# Patient Record
Sex: Female | Born: 1955 | Race: White | Hispanic: No | Marital: Married | Smoking: Former smoker
Health system: Southern US, Community
[De-identification: ages and names within clinical notes are randomized; demographics above are authoritative.]

## PROBLEM LIST (undated history)

## (undated) DIAGNOSIS — I8500 Esophageal varices without bleeding: Secondary | ICD-10-CM

## (undated) DIAGNOSIS — K7030 Alcoholic cirrhosis of liver without ascites: Secondary | ICD-10-CM

## (undated) DIAGNOSIS — F1021 Alcohol dependence, in remission: Secondary | ICD-10-CM

## (undated) HISTORY — DX: Alcoholic cirrhosis of liver without ascites: K70.30

## (undated) HISTORY — PX: UPPER GASTROINTESTINAL ENDOSCOPY: SHX188

## (undated) HISTORY — PX: APPENDECTOMY: SHX54

## (undated) HISTORY — DX: Esophageal varices without bleeding: I85.00

## (undated) HISTORY — DX: Alcohol dependence, in remission: F10.21

## (undated) HISTORY — PX: OTHER SURGICAL HISTORY: SHX169

---

## 2002-08-13 ENCOUNTER — Encounter: Payer: Self-pay | Admitting: Internal Medicine

## 2002-08-13 ENCOUNTER — Inpatient Hospital Stay (HOSPITAL_COMMUNITY): Admission: EM | Admit: 2002-08-13 | Discharge: 2002-08-20 | Payer: Self-pay | Admitting: Emergency Medicine

## 2002-08-14 ENCOUNTER — Encounter: Payer: Self-pay | Admitting: Internal Medicine

## 2002-08-15 ENCOUNTER — Encounter: Payer: Self-pay | Admitting: Internal Medicine

## 2002-08-16 ENCOUNTER — Encounter: Payer: Self-pay | Admitting: Internal Medicine

## 2002-08-17 ENCOUNTER — Encounter: Payer: Self-pay | Admitting: Internal Medicine

## 2002-08-20 ENCOUNTER — Encounter: Payer: Self-pay | Admitting: Internal Medicine

## 2002-09-04 ENCOUNTER — Ambulatory Visit (HOSPITAL_COMMUNITY): Admission: RE | Admit: 2002-09-04 | Discharge: 2002-09-04 | Payer: Self-pay | Admitting: *Deleted

## 2002-09-04 ENCOUNTER — Encounter: Payer: Self-pay | Admitting: Internal Medicine

## 2002-12-29 ENCOUNTER — Ambulatory Visit (HOSPITAL_COMMUNITY): Admission: RE | Admit: 2002-12-29 | Discharge: 2002-12-29 | Payer: Self-pay | Admitting: Internal Medicine

## 2002-12-29 DIAGNOSIS — K766 Portal hypertension: Secondary | ICD-10-CM

## 2004-08-17 ENCOUNTER — Ambulatory Visit: Payer: Self-pay | Admitting: Internal Medicine

## 2004-08-21 ENCOUNTER — Ambulatory Visit (HOSPITAL_COMMUNITY): Admission: RE | Admit: 2004-08-21 | Discharge: 2004-08-21 | Payer: Self-pay | Admitting: Internal Medicine

## 2004-08-24 ENCOUNTER — Ambulatory Visit: Payer: Self-pay | Admitting: Internal Medicine

## 2004-08-24 DIAGNOSIS — I8500 Esophageal varices without bleeding: Secondary | ICD-10-CM

## 2005-03-26 ENCOUNTER — Ambulatory Visit: Payer: Self-pay | Admitting: Internal Medicine

## 2005-03-28 ENCOUNTER — Ambulatory Visit (HOSPITAL_COMMUNITY): Admission: RE | Admit: 2005-03-28 | Discharge: 2005-03-28 | Payer: Self-pay | Admitting: Internal Medicine

## 2005-09-13 ENCOUNTER — Ambulatory Visit: Payer: Self-pay | Admitting: Internal Medicine

## 2006-02-20 ENCOUNTER — Ambulatory Visit: Payer: Self-pay | Admitting: Internal Medicine

## 2006-02-28 ENCOUNTER — Ambulatory Visit: Payer: Self-pay | Admitting: Internal Medicine

## 2006-02-28 LAB — CONVERTED CEMR LAB: Potassium: 4.2 meq/L (ref 3.5–5.1)

## 2006-05-13 ENCOUNTER — Ambulatory Visit: Payer: Self-pay | Admitting: Internal Medicine

## 2006-05-15 LAB — CONVERTED CEMR LAB
Creatinine, Ser: 0.7 mg/dL (ref 0.4–1.2)
Total CK: 49 units/L (ref 7–177)

## 2006-05-17 ENCOUNTER — Ambulatory Visit (HOSPITAL_COMMUNITY): Admission: RE | Admit: 2006-05-17 | Discharge: 2006-05-17 | Payer: Self-pay | Admitting: Internal Medicine

## 2006-09-04 ENCOUNTER — Ambulatory Visit: Payer: Self-pay | Admitting: Internal Medicine

## 2006-09-04 LAB — CONVERTED CEMR LAB
AFP-Tumor Marker: 5.1 ng/mL (ref 0.0–8.0)
AST: 24 units/L (ref 0–37)
Basophils Absolute: 0 10*3/uL (ref 0.0–0.1)
Bilirubin, Direct: 0.2 mg/dL (ref 0.0–0.3)
Chloride: 110 meq/L (ref 96–112)
Creatinine, Ser: 0.6 mg/dL (ref 0.4–1.2)
Eosinophils Absolute: 0.1 10*3/uL (ref 0.0–0.6)
Eosinophils Relative: 1.6 % (ref 0.0–5.0)
GFR calc non Af Amer: 111 mL/min
Glucose, Bld: 119 mg/dL — ABNORMAL HIGH (ref 70–99)
HCT: 37.4 % (ref 36.0–46.0)
Hemoglobin: 13.4 g/dL (ref 12.0–15.0)
Lymphocytes Relative: 36.1 % (ref 12.0–46.0)
MCHC: 35.8 g/dL (ref 30.0–36.0)
MCV: 87.9 fL (ref 78.0–100.0)
Monocytes Absolute: 0.3 10*3/uL (ref 0.2–0.7)
Neutrophils Relative %: 56.2 % (ref 43.0–77.0)
Potassium: 4 meq/L (ref 3.5–5.1)
RBC: 4.25 M/uL (ref 3.87–5.11)
Sodium: 144 meq/L (ref 135–145)
Total Bilirubin: 0.8 mg/dL (ref 0.3–1.2)
WBC: 5.7 10*3/uL (ref 4.5–10.5)

## 2006-09-06 ENCOUNTER — Ambulatory Visit (HOSPITAL_COMMUNITY): Admission: RE | Admit: 2006-09-06 | Discharge: 2006-09-06 | Payer: Self-pay | Admitting: Internal Medicine

## 2007-07-04 DIAGNOSIS — D696 Thrombocytopenia, unspecified: Secondary | ICD-10-CM

## 2007-07-04 DIAGNOSIS — M810 Age-related osteoporosis without current pathological fracture: Secondary | ICD-10-CM | POA: Insufficient documentation

## 2007-07-07 ENCOUNTER — Ambulatory Visit: Payer: Self-pay | Admitting: Internal Medicine

## 2007-07-07 LAB — CONVERTED CEMR LAB
AFP-Tumor Marker: 4.8 ng/mL (ref 0.0–8.0)
ALT: 18 units/L (ref 0–35)
AST: 25 units/L (ref 0–37)
Albumin: 4.3 g/dL (ref 3.5–5.2)
Alkaline Phosphatase: 83 units/L (ref 39–117)
BUN: 8 mg/dL (ref 6–23)
Basophils Absolute: 0 10*3/uL (ref 0.0–0.1)
Basophils Relative: 0.6 % (ref 0.0–1.0)
Bilirubin, Direct: 0.2 mg/dL (ref 0.0–0.3)
CO2: 28 meq/L (ref 19–32)
Calcium: 9.6 mg/dL (ref 8.4–10.5)
Chloride: 103 meq/L (ref 96–112)
Creatinine, Ser: 0.7 mg/dL (ref 0.4–1.2)
Eosinophils Absolute: 0.1 10*3/uL (ref 0.0–0.6)
Eosinophils Relative: 1.1 % (ref 0.0–5.0)
GFR calc Af Amer: 112 mL/min
GFR calc non Af Amer: 93 mL/min
Glucose, Bld: 103 mg/dL — ABNORMAL HIGH (ref 70–99)
HCT: 38.4 % (ref 36.0–46.0)
Hemoglobin: 13.1 g/dL (ref 12.0–15.0)
INR: 1.1 — ABNORMAL HIGH (ref 0.8–1.0)
Lymphocytes Relative: 27.1 % (ref 12.0–46.0)
MCHC: 34.2 g/dL (ref 30.0–36.0)
MCV: 91.5 fL (ref 78.0–100.0)
Monocytes Absolute: 0.4 10*3/uL (ref 0.2–0.7)
Monocytes Relative: 5.6 % (ref 3.0–11.0)
Neutro Abs: 4.5 10*3/uL (ref 1.4–7.7)
Neutrophils Relative %: 65.6 % (ref 43.0–77.0)
Platelets: 98 10*3/uL — ABNORMAL LOW (ref 150–400)
Potassium: 4 meq/L (ref 3.5–5.1)
Prothrombin Time: 13 s (ref 10.9–13.3)
RBC: 4.19 M/uL (ref 3.87–5.11)
RDW: 12.5 % (ref 11.5–14.6)
Sodium: 140 meq/L (ref 135–145)
TSH: 1.25 microintl units/mL (ref 0.35–5.50)
Total Bilirubin: 0.9 mg/dL (ref 0.3–1.2)
Total Protein: 7.5 g/dL (ref 6.0–8.3)
WBC: 6.8 10*3/uL (ref 4.5–10.5)

## 2007-07-11 ENCOUNTER — Ambulatory Visit (HOSPITAL_COMMUNITY): Admission: RE | Admit: 2007-07-11 | Discharge: 2007-07-11 | Payer: Self-pay | Admitting: Internal Medicine

## 2008-02-18 ENCOUNTER — Ambulatory Visit: Payer: Self-pay | Admitting: Internal Medicine

## 2008-09-07 ENCOUNTER — Ambulatory Visit: Payer: Self-pay | Admitting: Internal Medicine

## 2008-09-08 ENCOUNTER — Ambulatory Visit: Payer: Self-pay | Admitting: Internal Medicine

## 2008-09-09 ENCOUNTER — Ambulatory Visit: Payer: Self-pay | Admitting: Internal Medicine

## 2008-09-13 ENCOUNTER — Ambulatory Visit (HOSPITAL_COMMUNITY): Admission: RE | Admit: 2008-09-13 | Discharge: 2008-09-13 | Payer: Self-pay | Admitting: Internal Medicine

## 2008-09-17 LAB — CONVERTED CEMR LAB
AFP-Tumor Marker: 4.1 ng/mL (ref 0.0–8.0)
ALT: 19 units/L (ref 0–35)
AST: 35 units/L (ref 0–37)
Albumin: 4.2 g/dL (ref 3.5–5.2)
Alkaline Phosphatase: 79 units/L (ref 39–117)
BUN: 10 mg/dL (ref 6–23)
Basophils Absolute: 0 10*3/uL (ref 0.0–0.1)
Basophils Relative: 0.4 % (ref 0.0–3.0)
CO2: 28 meq/L (ref 19–32)
Calcium: 9.4 mg/dL (ref 8.4–10.5)
Chloride: 108 meq/L (ref 96–112)
Cholesterol: 230 mg/dL — ABNORMAL HIGH (ref 0–200)
Creatinine, Ser: 0.7 mg/dL (ref 0.4–1.2)
Direct LDL: 163.1 mg/dL
Eosinophils Absolute: 0.1 10*3/uL (ref 0.0–0.7)
Eosinophils Relative: 2.5 % (ref 0.0–5.0)
GFR calc non Af Amer: 92.26 mL/min (ref >60)
Glucose, Bld: 83 mg/dL (ref 70–99)
HCT: 41.7 % (ref 36.0–46.0)
HDL: 41 mg/dL (ref >39.00)
Hemoglobin: 14.6 g/dL (ref 12.0–15.0)
INR: 1.1 — ABNORMAL HIGH (ref 0.8–1.0)
Lymphocytes Relative: 30.6 % (ref 12.0–46.0)
Lymphs Abs: 1.8 10*3/uL (ref 0.7–4.0)
MCHC: 35 g/dL (ref 30.0–36.0)
MCV: 92.3 fL (ref 78.0–100.0)
Monocytes Absolute: 0.3 10*3/uL (ref 0.1–1.0)
Monocytes Relative: 4.4 % (ref 3.0–12.0)
Neutro Abs: 3.6 10*3/uL (ref 1.4–7.7)
Neutrophils Relative %: 62.1 % (ref 43.0–77.0)
Platelets: 81 10*3/uL — ABNORMAL LOW (ref 150.0–400.0)
Potassium: 4.4 meq/L (ref 3.5–5.1)
Prothrombin Time: 11.9 s (ref 10.9–13.3)
RBC: 4.51 M/uL (ref 3.87–5.11)
RDW: 12.8 % (ref 11.5–14.6)
Sodium: 143 meq/L (ref 135–145)
Total Bilirubin: 1.2 mg/dL (ref 0.3–1.2)
Total CHOL/HDL Ratio: 6
Total Protein: 7.5 g/dL (ref 6.0–8.3)
Triglycerides: 101 mg/dL (ref 0.0–149.0)
VLDL: 20.2 mg/dL (ref 0.0–40.0)
Vit D, 25-Hydroxy: 23 ng/mL — ABNORMAL LOW (ref 30–89)
WBC: 5.8 10*3/uL (ref 4.5–10.5)

## 2009-08-22 ENCOUNTER — Ambulatory Visit: Payer: Self-pay | Admitting: Internal Medicine

## 2009-08-22 DIAGNOSIS — E785 Hyperlipidemia, unspecified: Secondary | ICD-10-CM | POA: Insufficient documentation

## 2009-08-23 ENCOUNTER — Encounter: Payer: Self-pay | Admitting: Internal Medicine

## 2009-08-23 LAB — CONVERTED CEMR LAB
AFP-Tumor Marker: 4.1 ng/mL (ref 0.0–8.0)
ALT: 17 units/L (ref 0–35)
AST: 23 units/L (ref 0–37)
Albumin: 4.2 g/dL (ref 3.5–5.2)
Alkaline Phosphatase: 71 units/L (ref 39–117)
BUN: 9 mg/dL (ref 6–23)
Basophils Absolute: 0 10*3/uL (ref 0.0–0.1)
Basophils Relative: 0.5 % (ref 0.0–3.0)
CO2: 28 meq/L (ref 19–32)
Calcium: 9.1 mg/dL (ref 8.4–10.5)
Chloride: 108 meq/L (ref 96–112)
Cholesterol: 210 mg/dL — ABNORMAL HIGH (ref 0–200)
Creatinine, Ser: 0.7 mg/dL (ref 0.4–1.2)
Direct LDL: 164.2 mg/dL
Eosinophils Absolute: 0.1 10*3/uL (ref 0.0–0.7)
Eosinophils Relative: 1.8 % (ref 0.0–5.0)
GFR calc non Af Amer: 91.94 mL/min (ref >60)
Glucose, Bld: 83 mg/dL (ref 70–99)
HCT: 39.6 % (ref 36.0–46.0)
HDL: 47.2 mg/dL (ref >39.00)
Hemoglobin: 13.7 g/dL (ref 12.0–15.0)
Lymphocytes Relative: 38.3 % (ref 12.0–46.0)
Lymphs Abs: 2 10*3/uL (ref 0.7–4.0)
MCHC: 34.6 g/dL (ref 30.0–36.0)
MCV: 94 fL (ref 78.0–100.0)
Monocytes Absolute: 0.3 10*3/uL (ref 0.1–1.0)
Monocytes Relative: 5.9 % (ref 3.0–12.0)
Neutro Abs: 2.8 10*3/uL (ref 1.4–7.7)
Neutrophils Relative %: 53.5 % (ref 43.0–77.0)
Platelets: 98 10*3/uL — ABNORMAL LOW (ref 150.0–400.0)
Potassium: 4 meq/L (ref 3.5–5.1)
RBC: 4.21 M/uL (ref 3.87–5.11)
RDW: 14 % (ref 11.5–14.6)
Sodium: 144 meq/L (ref 135–145)
TSH: 1.71 microintl units/mL (ref 0.35–5.50)
Total Bilirubin: 0.8 mg/dL (ref 0.3–1.2)
Total CHOL/HDL Ratio: 4
Total Protein: 7.3 g/dL (ref 6.0–8.3)
Triglycerides: 53 mg/dL (ref 0.0–149.0)
VLDL: 10.6 mg/dL (ref 0.0–40.0)
Vit D, 25-Hydroxy: 26 ng/mL — ABNORMAL LOW (ref 30–89)
WBC: 5.2 10*3/uL (ref 4.5–10.5)

## 2009-08-25 ENCOUNTER — Ambulatory Visit (HOSPITAL_COMMUNITY): Admission: RE | Admit: 2009-08-25 | Discharge: 2009-08-25 | Payer: Self-pay | Admitting: Internal Medicine

## 2009-09-02 ENCOUNTER — Telehealth: Payer: Self-pay | Admitting: Internal Medicine

## 2009-09-02 DIAGNOSIS — K7460 Unspecified cirrhosis of liver: Secondary | ICD-10-CM

## 2010-01-13 ENCOUNTER — Telehealth: Payer: Self-pay | Admitting: Internal Medicine

## 2010-06-13 NOTE — Progress Notes (Signed)
Summary: [facility]   Imported By: Sherian Rein 09/05/2009 12:11:50  _____________________________________________________________________  External Attachment:    Type:   Image     Comment:   External Document

## 2010-06-13 NOTE — Progress Notes (Signed)
Summary: ? setting up EGD/Colon in Oct  Phone Note Other Incoming   Summary of Call: -------------------------------------------------------------------------------- From: [email or web address] [[email or web address]]  Sent: Tuesday, January 10, 2010 10:32 AM To: Stan Head Subject:    Hi Dr Leone Payor I hope you and your family are all well. As discussed when I saw you last we spoke about my contacting you regarding the procedure we discussed for when I am next in [place]. We are in [place] the beginning of October, but friends have decided to come and stay with [place], they leave 13th October, then my husband goes to [place] golfing 16th October, this leaves very little time, would you suggest leaving this until next time I am in [place], unless you have the Friday free although I do realise this is a very small window, I will await your decision, thank you Enza Crooke  PLEASE LOOK AT THE DATES SHE INMDICATED HERE (A LITTLE VAGUE) AND SEE IF I HAVE SLOTS AVAILABLE FOR AN ENDO/COLON Initial call taken by: Iva Boop MD, Clementeen Graham,  January 13, 2010 7:17 AM  Follow-up for Phone Call        Dr. Leone Payor, you do not have any slots between 10/13 and 10/16.  Are we able to call the pt to clarify dates or do we have to wait until she is back [place]?  We could do it on the 11th if she is willing to do it while her friends are here. Follow-up by: Francee Piccolo CMA Duncan Dull),  January 13, 2010 4:58 PM  Additional Follow-up for Phone Call Additional follow up Details #1::        I will tell her to try again next time she is here.

## 2010-06-13 NOTE — Progress Notes (Signed)
Summary: [facility]   Imported By: Sherian Rein 09/05/2009 12:08:07  _____________________________________________________________________  External Attachment:    Type:   Image     Comment:   External Document

## 2010-06-13 NOTE — Assessment & Plan Note (Signed)
Summary: F/U CIRRHOSIS   History of Present Illness Visit Type: Follow-up Visit Primary GI MD: Stan Head MD [facility] Primary Provider: n/a Requesting Provider: n/a Chief Complaint: abnormal liver function tests History of Present Illness:   [name] with alcoholic cirrhosis and history of abnormal liver (?nodule) on MRI. Doing well without any problems. No complaints. Her dog died last year at 32 years of age. this was traumatic and she and her husband travelled to [place] and [place] to distract themselves. So, she did not come to [place] last Fall.   GI Review of Systems      Denies abdominal pain, acid reflux, belching, bloating, chest pain, dysphagia with liquids, dysphagia with solids, heartburn, loss of appetite, nausea, vomiting, vomiting blood, weight loss, and  weight gain.      Reports liver problems.     Denies anal fissure, black tarry stools, change in bowel habit, constipation, diarrhea, diverticulosis, fecal incontinence, heme positive stool, hemorrhoids, irritable bowel syndrome, jaundice, light color stool, rectal bleeding, and  rectal pain.    Current Medications (verified): 1)  Caltrate 600+d 600-400 Mg-Unit Tabs (Calcium Carbonate-Vitamin D) .... One Tablet By Mouth Two Times A Day 2)  Boniva 150 Mg Tabs (Ibandronate Sodium) .... Once Every Month  Allergies (verified): No Known Drug Allergies  Past History:  Past Medical History: Reviewed history from 02/18/2008 and no changes required. Alcoholic cirrhosis Small esophageal varies, mild portal gastropathy, prior bleeding 2004 Mild thrombocytopenia Osteoporosis  Past Surgical History: Reviewed history from 07/04/2007 and no changes required. Appendectomy  Family History: Reviewed history from 02/18/2008 and no changes required. No FH of Colon Cancer: Family History of Liver Cancer:Grandfather, Uncle Family History of Stomach Cancer:Grandfather, uncle  Stomach was the primary  cancer Family History of Heart Disease: Mother had stroke  Social History: Reviewed history from 09/07/2008 and no changes required. Patient is a former smoker.  Alcohol Use - occasional verly low alcohol beer Daily Caffeine Use -coffee daily Illicit Drug Use - no Patient gets regular exercise. Married, resides in [place], travels to [place] intermiitently  Vital Signs:  Patient profile:   55 year old female Height:      61.5 inches Weight:      109.50 pounds BMI:     20.43 Pulse rate:   60 / minute Pulse rhythm:   regular BP sitting:   104 / 60  (left arm) Cuff size:   regular  Vitals Entered By: June McMurray CMA Duncan Dull) (August 22, 2009 3:57 PM)  Physical Exam  General:  Well developed, well nourished, no acute distress. Eyes:   no icterus. Lungs:  Clear throughout to auscultation. Heart:  Regular rate and rhythm; no murmurs, rubs,  or bruits. Abdomen:  Soft, nontender and nondistended. No masses, hepatosplenomegaly or hernias noted. Normal bowel sounds. Extremities:  no edema   Impression & Recommendations:  Problem # 1:  CIRRHOSIS, ALCOHOLIC (ICD-571.2) Assessment Unchanged She continues to do well and thrombocytopenia, trace varices/portal gastropathy  in past are only manifestations. Need to pull old paper records re: prior serologies and see if she did receive HBV vaccine. 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 Plan to reassess later this year (Fall).  Problem # 3:  HYPERLIPIDEMIA (ICD-272.4) Assessment: Unchanged  Orders: TLB-Lipid Panel (80061-LIPID)  Problem #  4:  ABNORMAL LIVER ON MRI 1/08 (ICD-793.3) Assessment: Unchanged Nodule  in past. We discussed different or no imaging at this time but she prefers to continue annual MRI. Orders: MRI Abdomen (MRI Abdomen)  Patient Instructions: 1)  Please go to the basement to have your lab tests drawn while fasting tomorrow morning. 2)  Contact Dr. Leone Payor by Email in late August or early September to arrange for Upper Endoscopy and colonoscopy when you are here in October 2011. 3)  Once your MRI and labs are complete we will call you with the results as well as send you copies of the reports.  4)  Your MRI is scheduled for 08/25/09 @ 7PM @ [facility].  Please see seperate instructions. 5)  The medication list was reviewed and reconciled.  All changed / newly prescribed medications were explained.  A complete medication list was provided to the patient / caregiver.  Appended Document: F/U CIRRHOSIS     EGD  Procedure date:  08/13/2002  Findings:      Grade II esophageal varices portal gastropathy esophagitis gastritis,duodenitis  D. Brodie  EGD  Procedure date:  08/17/2002  Findings:      Grade I Varices Portal gastropathy  EGD  Procedure date:  12/29/2002  Findings:      Grade I esophageal varices portal gastropathy  Leone Payor    Impression & Recommendations:  Problem # 1:  CIRRHOSIS, ALCOHOLIC (ICD-571.2) Assessment Comment Only abstinent since hospitalization with bleeding and encephalopathy 2004 small esoph varices and mild gastropathy 2006 EGD HCV Ab neg HAV total + Ferritin not elevatedANA neg AMA neg anti smooth neg TTG Ab neg hx of abnl MRI, ? neoplastic lesions, AFP and f/u MR ok mild thrombocytopenia, splenomegaly   Need to check on hepatitis B immunity status   Appended Document: F/U CIRRHOSIS we called her and she confirms she was vaccinated for Hep B

## 2010-06-13 NOTE — Progress Notes (Signed)
Summary: Hep B status?  Phone Note Outgoing Call   Summary of Call: please contact her (9if still in [place]) re: we do not have her hepatitis B status - thought we did but chart review does not show and she should have Hep B surface antibody and antigen tested if never has 9not in our records) let me know how we can Email her in [place], too Iva Boop MD, [facility]  September 02, 2009 4:13 PM   Follow-up for Phone Call        Left message for patient to call back\par Darcey Nora RN, [facility]  September 05, 2009 9:58 AM  I spoke with the patient she has had Hep B vaccine.  Her email is  [email or web address]   Follow-up by: Darcey Nora RN, CGRN,  September 05, 2009 12:58 PM  Additional Follow-up for Phone Call Additional follow up Details #1::        Patient called     Appended Document: Hep B status? please add that to flowsheet (Hep B)  Appended Document: Hep B status?    Clinical Lists Changes  Observations: Added new observation of HEPBVAX#4: completed series (09/27/2009 14:59)

## 2010-09-25 ENCOUNTER — Telehealth: Payer: Self-pay

## 2010-09-25 DIAGNOSIS — K7460 Unspecified cirrhosis of liver: Secondary | ICD-10-CM

## 2010-09-25 NOTE — Telephone Encounter (Signed)
Patient is scheduled for EGD [facility] 09/27/10 4:00, Pre-visit 09/26/10 8:30 and will also have labs drawn.  MRI is scheduled with [facility] 10/02/10 10:00.  Patient advised.

## 2010-09-25 NOTE — Telephone Encounter (Signed)
Message copied by Darcey Nora on Mon Sep 25, 2010  9:30 AM ------      Message from: Stan Head      Created: Thu Sep 21, 2010  6:07 PM      Regarding: scheduling procedures/MRI       She is arriving in [place] may 11      She is to have MR liver with contrast to follow-up cirrhosis            Will need a CBC, CMET, PT/INR and alpha-fetoprotein            Also needs to schedule an EGD and colonoscopy (varices and screening)            Hopefully her phone # we have is up to date

## 2010-09-26 ENCOUNTER — Other Ambulatory Visit (INDEPENDENT_AMBULATORY_CARE_PROVIDER_SITE_OTHER): Payer: Self-pay

## 2010-09-26 ENCOUNTER — Ambulatory Visit (AMBULATORY_SURGERY_CENTER): Payer: Self-pay | Admitting: *Deleted

## 2010-09-26 VITALS — Ht 61.5 in | Wt 110.1 lb

## 2010-09-26 DIAGNOSIS — I8500 Esophageal varices without bleeding: Secondary | ICD-10-CM

## 2010-09-26 DIAGNOSIS — K7460 Unspecified cirrhosis of liver: Secondary | ICD-10-CM

## 2010-09-26 LAB — CBC WITH DIFFERENTIAL/PLATELET
Basophils Absolute: 0 10*3/uL (ref 0.0–0.1)
Basophils Relative: 0.4 % (ref 0.0–3.0)
Eosinophils Absolute: 0.1 10*3/uL (ref 0.0–0.7)
Eosinophils Relative: 1.9 % (ref 0.0–5.0)
HCT: 41 % (ref 36.0–46.0)
Hemoglobin: 14.2 g/dL (ref 12.0–15.0)
Lymphocytes Relative: 32.7 % (ref 12.0–46.0)
Lymphs Abs: 1.6 10*3/uL (ref 0.7–4.0)
MCHC: 34.6 g/dL (ref 30.0–36.0)
MCV: 93.6 fl (ref 78.0–100.0)
Neutro Abs: 2.8 10*3/uL (ref 1.4–7.7)
Neutrophils Relative %: 58.7 % (ref 43.0–77.0)
Platelets: 104 10*3/uL — ABNORMAL LOW (ref 150.0–400.0)
RBC: 4.38 Mil/uL (ref 3.87–5.11)
RDW: 12.9 % (ref 11.5–14.6)
WBC: 4.8 10*3/uL (ref 4.5–10.5)

## 2010-09-26 LAB — COMPREHENSIVE METABOLIC PANEL
ALT: 15 U/L (ref 0–35)
Albumin: 4.2 g/dL (ref 3.5–5.2)
Alkaline Phosphatase: 73 U/L (ref 39–117)
BUN: 12 mg/dL (ref 6–23)
CO2: 28 mEq/L (ref 19–32)
Calcium: 9.4 mg/dL (ref 8.4–10.5)
Chloride: 107 mEq/L (ref 96–112)
GFR: 113.77 mL/min (ref >60.00)
Glucose, Bld: 81 mg/dL (ref 70–99)
Potassium: 4.2 mEq/L (ref 3.5–5.1)
Sodium: 142 mEq/L (ref 135–145)
Total Bilirubin: 0.9 mg/dL (ref 0.3–1.2)
Total Protein: 7.1 g/dL (ref 6.0–8.3)

## 2010-09-26 LAB — AFP TUMOR MARKER: AFP-Tumor Marker: 3.3 ng/mL (ref 0.0–8.0)

## 2010-09-26 LAB — PROTIME-INR: INR: 1.1 ratio — ABNORMAL HIGH (ref 0.8–1.0)

## 2010-09-26 NOTE — Assessment & Plan Note (Signed)
[facility]                         GASTROENTEROLOGY OFFICE NOTE   NAME:Briana Johnson, Briana Johnson                      MRN:          [id number]  DATE:07/07/2007                            DOB:          26-Jul-1955    CHIEF COMPLAINT:  Followup of alcoholic liver disease and cirrhosis.   HISTORY:  Briana Johnson [place] a [place] of [place]  from [place].  Briana Johnson is really doing well and needs some surveillance  procedures.  Briana Johnson has no particular complaints at this time.   OBJECTIVE:  VITAL SIGNS:  Her weight is stable at 115 pounds.  Her blood  pressure is 118/66 and her pulse is 63.  GENERAL:  Briana Johnson is petite, well-developed, well-nourished.  HEENT:  Eyes anicteric.  LUNGS:  Clear.  HEART:  S1, S2 with no murmurs, rubs or gallops.  ABDOMEN:  Soft, nontender, no organomegaly or mass.  NEUROLOGICAL:  Briana Johnson is alert and oriented x3.  EXTREMITIES:  There is no peripheral edema.   MEDICATIONS:  Her medications are Boniva and Caltrate.   ASSESSMENT:  Briana Johnson has alcoholic cirrhosis.  In the past there have been  some possible lesions suspicious for neoplasia on MRI, though last  year's MRI was okay as well as her alpha fetoprotein.   PLAN:  1. Repeat MRI, alpha fetoprotein, CBC, CMET and Pro Time to restage      things.  Briana Johnson does have a chronic mild thrombocytopenia.  2. Will call those results.  3. Briana Johnson does not wish to pursue an EGD to look at her very small      varices and does not wish to schedule a screening colonoscopy at      this time but will discuss that upon return.     Iva Boop, MD,FACG  Electronically Signed    CEG/MedQ  DD: 07/07/2007  DT: 07/08/2007  Job #: [id number]

## 2010-09-27 ENCOUNTER — Encounter: Payer: Self-pay | Admitting: Internal Medicine

## 2010-09-27 ENCOUNTER — Ambulatory Visit (AMBULATORY_SURGERY_CENTER): Payer: Self-pay | Admitting: Internal Medicine

## 2010-09-27 VITALS — BP 139/69 | HR 74 | Temp 99.3°F | Resp 25 | Ht 61.0 in | Wt 108.0 lb

## 2010-09-27 DIAGNOSIS — R933 Abnormal findings on diagnostic imaging of other parts of digestive tract: Secondary | ICD-10-CM

## 2010-09-27 DIAGNOSIS — I8500 Esophageal varices without bleeding: Secondary | ICD-10-CM

## 2010-09-27 MED ORDER — SODIUM CHLORIDE 0.9 % IV SOLN: 500.0000 mL | INTRAVENOUS | Status: DC

## 2010-09-27 NOTE — Patient Instructions (Addendum)
Your lab testing was fine. The platelets remain low but this is mild and not a problem. The varices remain in the esophagus but are still small and are low risk for rupture and bleeding.  I will call when the MRI results are in. Iva Boop, MD, [facility]  DISCHARGE INSTRUCTIONS ( BLUE & GREEN SHEETS)  REVIEWED WITH PT. AND CAREPARTNER. SAFETY PRECAUTIONS ADVISED DISCUSSED WITH CAREPARTNER DUE TO SEDATING MEDICATIONS RECEIVED TODAY . RECOMMENDED DIET FOR TODAY GIVEN TO CAREPARTNER.

## 2010-09-28 ENCOUNTER — Telehealth: Payer: Self-pay

## 2010-09-28 NOTE — Telephone Encounter (Signed)
Follow up Call- Patient questions:  Do you have a fever, pain , or abdominal swelling? no  Pain Score  0 *  Have you tolerated food without any problems? yes  Have you been able to return to your normal activities? yes  Do you have any questions about your discharge instructions: Diet   no Medications  no Follow up visit  no  Do you have questions or concerns about your Care? no  Actions: * If pain score is 4 or above: No action needed, pain <4. "Every thing is fine.  Thank you for your concern" per pt. MAW

## 2010-09-29 NOTE — Discharge Summary (Signed)
Briana Johnson, Briana Johnson                           ACCOUNT NO.:  [id number]   MEDICAL RECORD NO.:  [id number]                   PATIENT TYPE:  INP   LOCATION:  5709                                 FACILITY:  [facility]   PHYSICIAN:  Iva Boop, M.D. [facility]           DATE OF BIRTH:  03/18/1956   DATE OF ADMISSION:  08/13/2002  DATE OF DISCHARGE:  08/20/2002                                 DISCHARGE SUMMARY   ADMITTING DIAGNOSES:  1. Acute upper gastrointestinal bleed in the setting of coagulopathy.  Rule     out variceal bleed.  2. Severe anemia.  3. Coagulopathy.  4. Hypovolemic shock.  5. Confusion.  6. History of gastroesophageal reflux disease.  7. Status post appendectomy.   DISCHARGE DIAGNOSES:  1. Gastrointestinal bleeding secondary to portal gastropathy.  2. Esophageal varices, portal hypertensive gastropathy and duodenopathy.  3. Gastroesophageal reflux disease.  4. Anemia secondary to gastrointestinal bleed.  Status post transfusions     with a total of four units of packed red blood cells.  5. Hypernatremia, improving, not resolved.  6. Mild hypokalemia, corrected.  7. Cirrhosis of the liver.  The presumed etiology is alcoholism, though the     patient denies heavy volume of alcohol consumption.  8. Coagulopathy secondary to liver disease, improved but not corrected with     fresh frozen plasma and vitamin K.  9. Thrombocytopenia.  10.      Hypotension and shock secondary to gastrointestinal bleed.  11.      Encephalopathy, possible secondary to hepatic etiology, but most     likely secondary to alcohol withdrawal.  12.      Ascites.  Attempt made for diagnostic paracentesis; however, the     fluid was minimal in volume and located and the midline of the abdomen     which increased the risk of perforating blood vessels; therefore,     paracentesis was not performed.   CONSULTATIONS:  Dr. Yancey Flemings for GI evaluation.  Ultimately the patient  was transferred to the  [facility].   PROCEDURES:  Upper endoscopy for the first time on 08/13/02 by Dr. Lina Sar.  No active bleeding was seen, but there was evidence of recent bleed  in the form of coffee ground material in the stomach.  Grade I to II  esophageal varices seen.  Primary degree reflux esophagitis seen.  There was  portal hypertensive gastropathy, along with duodenitis seen, as well.  On  repeat EGD of 08/17/02 by Dr. Stan Head, this showed grade I varices that  had no stigmata of recent bleeding.  These were not large enough to band  ligate.  The gastric mucosa was erythematous, friable, and consistent with  severe portal gastropathy.  A small submucosal nodule in the antrum was also  noted of uncertain etiology.  This was not biopsied, and it was not felt to  be significant.  The duodenum also showed edema and erythema.  Placement of  central line by Dr. Rene Paci.   BRIEF HISTORY:  Briana Johnson is a pleasant 55 year old white female who lives  with Briana Johnson in [place].  Briana Johnson has business in the [place]  area, and so they have recently bought a house in [place].  They will be  here on occasional visits throughout the coming years.  They were in  [place] when the patient, as well as Briana Johnson, developed diarrhea and  nausea.  The Johnson's symptoms resolved fairly quickly.  The patient's  symptoms persisted, and she was throwing up what the Johnson describes as  coffee ground like; however, at the time he was not aware that this  signified GI bleeding.  She was also having dark loose stools.  They went to  see Dr. Amador Cunas on a couple of office visits earlier in the week prior  to admission.  A rectal exam was not performed, and no blood tests were  performed.  The diagnosis was that of food poisoning.  She was sent home  with orders to stick to liquids and to contact his office if there were any  further problems.  They got in touch with Dr.  Vernon Prey office by the  next day and were phoned in a prescription for Phenergan.  However, the  patient's Johnson became alarmed when the patient's mental status took a  turn for the worse, she got confused, and she was continuing to throw up and  have diarrhea.  He called 911, and she was transported to the emergency room  with a pulse of 126 and a blood pressure of 108/60s.  Indeed, she was  showing signs of acute encephalopathy.  Briana hemoglobin was 3.9.  Briana INR was  2.7.  BUN was 107 with a creatinine of 1.5.  Transaminases were elevated.  At that point, GI was involved, along with Dr. Felicity Coyer from the primary  care service, and she was admitted with suspicion of chronic liver disease  with acute variceal bleed.   LABORATORY DATA:  Initial white blood cell count was 25.3; it declined to  13.6.  Hemoglobin went from 3.9 to discharge level of 12.6.  Hematocrit went  from 11.8 to discharge level of 37.  Sodium reached a high of 151; it was  140 at discharge.  Potassium initially 2.6; it was 4.5 at discharge.  Glucose ranged anywhere between 101 and 133.  Calcium low at 7.1.  Albumin  low at 2.6.  BUN initially 64; corrected to 19.  Creatinine initially 1.0;  corrected to 0.9.  PT 25.2 at admission; 19.4 on final assay.  INR went from  2.7 down to 1.8.  PTT of 37.  Total bilirubin maximum 3.2, alkaline  phosphatase maximum 78, AST maximum 260, ALT maximum of 73.  Hepatitis C  antibody was negative.  Alpha fetoprotein level was 11, which is mildly  abnormal, reference range being 0 to 8.0.  CK 134, CK-MB 3.8, and troponin I  of 0.01.  UA and urine cultures negative.  Blood gases - the pH was 7.479,  PCO2 27.3, PO2 275, bicarb 20, and that is on 100% face mask.   IMAGING STUDIES:  Ultrasound of the abdomen showed thickened gallbladder  wall and gallbladder sludge, as well as cirrhosis and ascites.  Spleen was normal in size, measuring 11.4 cm.  The initial portable chest x-ray  showed  decreased interstitial opacity and perihilar air space opacity consistent  with improved edema.  Chest x-ray showed continued improvement in pulmonary  vascular congestion on 08/17/02 and again on 08/20/02.   HOSPITAL COURSE:  Problem 1.  Upper GI bleed.  The patient was in shock  secondary to blood and volume losses when she hit the emergency room.  She  was volume resuscitated with transfusions of packed red blood cells, as well  as normal saline.  She received a total of 4 units of packed red blood cells  during this admission.  The evening of Briana admission, she underwent urgent  upper endoscopy by Dr. Lina Sar.  The source of the bleeding was felt  probably the portal gastropathy, as the esophageal varices were rather small  and showed no stigmata of recent bleeding.  The patient was started on an  octreotide drip and stabilized with IV fluid hydration and cessation of  active bleeding.  After five days, the octreotide drip was discontinued, and  she had no further evidence for ongoing or recurrent GI bleeding.  Problem 2.  Anemia.  The anemia the patient had was certainly due to the  upper GI bleed, though suspect she may have a chronic underlying anemia due  to chronic liver disease, but this is only a guess.  In any event, she  responded well to transfusions, and once initially transfused with the  initial four units of packed red cells within the first 24 hours of  admission she did not require further packed red blood cells.  Problem 3.  Cirrhosis of the liver.  The patient had no inkling that she had  any underlying liver disease until this acute GI bleeding event occurred.  From Briana and Briana Johnson's history, she drinks half a bottle of wine most  nights, and may have a cocktail or two of whiskey, so perhaps 2-3 ounces of  whiskey a day.  During Briana hospitalization, she became acutely agitated in a  pattern more consistent with alcohol withdrawal than with alcoholic   encephalopathy and was treated with an Ativan taper.  She was mentally  unresponsive for the first four or five days of this admission, but once the  Ativan taper was discontinued, she woke up and became quite lucid and seemed  to have no chronic signs for encephalopathy.  She did received lactulose  early on, but Briana ammonia levels normalized, and the lactulose was  discontinued.  Problem 4.  Hypokalemia.  The patient was aggressively supplemented with IV  potassium, and Briana potassium level corrected.  Problem 5.  Coagulopathy.  This was noted when she came in, and she did  receive four units of fresh frozen plasma along with IV vitamin K.  Coags  responded, but did not completely normalize with these measures.  Problem 6.  Thrombocytopenia.  This is certainly a problem related to Briana  cirrhosis.  She never had critically low platelets, and she did not require  transfusions with any platelets. Problem 7.  Hypernatremia.  Dr. Leone Payor felt this might be secondary to  lactulose, and this was another reason for discontinuing this medication.  By the time of Briana discharge, Briana sodium was within normal limits.  Problem 8.  Hypotension and shock.  Once she was volume resuscitated and  received blood, the blood pressures improved, but she never had  significantly high blood pressure readings.  Toward the end of Briana ICU stay,  Briana blood pressures stabilized in the 120's/60-70 range.  The last 24 hours  of hospitalization, she was having  reading in the 90's to low 100's  systolic, and in the 50's to 60's diastolically.  She seemed to tolerate  these low blood pressures well, and some of this is certainly the affect of  the Inderal which was started in order to reduce portal pressures.  Since  she is tolerating this medication well and denies any dizziness or  orthostatic symptoms, we will continue this medication.  The patient did  require placement of a central IV line in order to maximize  volume  resuscitation.   CONDITION AT DISCHARGE:  Stable and improved.   PROGNOSIS:  Mrs. Ortmann and Briana Johnson have been made aware of the serious  condition of Briana liver.  At this point, if she stays off alcohol, she would  certainly be able to get placed on a transplant list.  The patient was  advised that she could also have episodes of gastrointestinal bleeding in  the future, and that she should be aware of anytime that she starts having  nausea and throwing up dark material or bloody material, as well as passing  dark or loose stools.  She is strongly advised not to consume any alcoholic  beverages.   RETURN OFFICE VISIT:  She has an appointment to see Dr. Yancey Flemings back in  the office on 09/01/02 at 10:15 in the morning.  At that point, he can  recheck Briana and give Briana and Briana Johnson the okay, if warranted, for Briana to  travel back to [place].  In the meantime, she is going to be staying at  their home in [place].   MEDICATIONS AT DISCHARGE:  1. Inderal/propranolol 50 mg one p.o. b.i.d.  2. Prilosec 20 mg one p.o. daily.  Okay for Briana to use over-the-counter     Prilosec.   DIET:  Low sodium.  A copy of a 4 gm sodium diet was printed out for the  patient to give Briana some guidelines.  She was also reminded, and is well  aware, that she should not drink alcohol.   ACTIVITY:  The patient was advised to take it easy, that she would probably  fatigue more easily than usual, and that she was not to lift anything  heavier than 10 pounds.   She is to limit any use of aspirin or nonsteroidal type pain relievers, and  it would be best for Briana not to use them at all, but once or twice a month  is probably okay.  For general pain relief, she can use Tylenol 1-2 per day  on occasion, but not to be using it on a daily basis.     Brett Canales, P.A. [facility]                    Iva Boop, M.D. [facility]    SG/MEDQ  D:  08/20/2002  T:  08/22/2002  Job:  [id number]

## 2010-09-29 NOTE — Op Note (Signed)
Briana Johnson, Briana Johnson                           ACCOUNT NO.:  [id number]   MEDICAL RECORD NO.:  [id number]                   PATIENT TYPE:  INP   LOCATION:  2115                                 FACILITY:  [facility]   PHYSICIAN:  Lina Sar, M.D. [facility]               DATE OF BIRTH:  1955/12/16   DATE OF PROCEDURE:  08/13/2002  DATE OF DISCHARGE:                                 OPERATIVE REPORT   PROCEDURE:  Upper endoscopy   GASTROENTEROLOGIST:  Hedwig Morton. Juanda Chance, M.D.   INDICATIONS FOR PROCEDURE:  This 55 year old white female was admitted today  with acute upper GI bleed and melanic stools.  Hemoglobin was 8 g.  Hemoccult-positive stool and severe coagulopathy with history of significant  alcohol use. She has no history of previous upper GI  bleed.  She has been  resuscitated with 6 units of packed cells and given 4 units of fresh frozen  plasma.  Her hemoglobin is 12.8.  She is undergoing upper endoscopy to  further evaluate her upper GI blood loss which is suspected to be related to  portal hypertension.   ENDOSCOPE:  Fujinon single-channel endoscope.   SEDATION:  Versed 10 mg IV, fentanyl 25 mcg IV.   FINDINGS:  Fujinon single-channel endoscope was passed under direct vision  through posterior pharynx into the esophagus.  The patient was monitored by  pulse oximetry.  Oxygen saturation was 95 to 98%.  The patient was rather  cooperative during the procedure.  Her gag reflex was diminished.  Proximal  esophageal mucosa showed some nonspecific erythema.  In the distal two-  thirds of the esophagus, there were 1 to 2+ esophageal varices. On  expiration, varices appeared to be 2+, on inspiration 0 to 1+.  The largest  varices appeared to be at the GE junction at 35 cm from the incisors.  There  was no blood in the esophagus.  There were some coffee ground specks  refluxing from the stomach. There was mild esophagitis overlying the distal  esophagus.  Three strings of esophageal varices  were identified.  There was  no significant hiatal hernia.   STOMACH:  The stomach was insufflated with air and showed a moderate amount  of coffee ground material which coated the gastric mucosa.  There was no  blood in the stomach. Mucosa was edematous with complete loss of submucosal  blood vessels.  There was friability as the endoscope traversed through the  stomach.  The trauma induced to the gastric mucosa caused some contact  bleeding.  Retroflexion of endoscope revealed no definite gastric varices.  Again, there were some abrasions over the gastric mucosa as the result of  the retroflexion of the endoscope.  Gastric antrum was also edematous but  with large folds converging into the pyloric outlet which was normal.   DUODENUM:  Duodenum bulb was inflamed.  No duodenal varices but some bright  red  blood and mucosal erosions throughout the bulb and the descending  duodenum.  These were consistent with acute duodenitis.  No definite varices  were noted in descending duodenum.  The duodenal bulb as well as stomach  were washed out to reveal any mucosal lesions other than edema, erythema,  and portal hypertensive gastropathy, but there was no discrete ulceration.   IMPRESSION:  1. Second grade esophageal varices.  2. First grade reflux esophagitis.  3. Hypertensive gastropathy.  4. Gastritis and duodenitis  5. No active bleeding, but there is evidence of recent gastrointestinal     blood loss with presence of coffee ground material in the stomach.   PLAN:  Esophageal varices were not banded or injected because there was no  active bleeding and because of the emergency procedure being carried out in  the middle of the night and patient being in VTs and coagulopathy not being  corrected completely.  She will be observed overnight.  We will hold off any  nasogastric suction, keep her sedated.  The patient will continue Protonix  IV and octreotide drip as well as Carafate slurry.   The patient's liver  disease will be evaluated as to the etiology and the severity, but because  of the GI bleed, which has been clearly related to the portal hypertension,  we may consider banding of the esophageal varices, especially the distal  ones which seem to be protruding into the esophageal lumen.   I have talked with patient's husband as to alcohol rehabilitation and future  modification of her lifestyle.                                                Lina Sar, M.D. [facility]    DB/MEDQ  D:  08/13/2002  T:  08/14/2002  Job:  [id number]

## 2010-09-29 NOTE — Assessment & Plan Note (Signed)
HEALTHCARE                           GASTROENTEROLOGY OFFICE NOTE   NAME:Struve, ELLAINA SCHULER                      MRN:          [id number]  DATE:02/20/2006                            DOB:          Aug 10, 1955    CHIEF COMPLAINT:  Follow up cirrhosis.   Ms. Brodzinski returns. In [place] she had a bone densitometry study that  was abnormal showing osteoporosis in the spine and osteopenia in the hip.  She was recommended to take Risedronate which is Actonel. She has held off  because of fears of gastrointestinal problems. She was given a calcium and  vitamin D supplement as well. She has not yet started that. Otherwise, she  has been well. She is getting ready to take a trip to [place] and [place]  and has recommended to have some vaccines including yellow fever and  malaria, typhoid, diphtheria, tetanus booster, and polio. She wonders if  these are safe.   She is willing to consider a screening colonoscopy and a follow up of her  endoscopy and MRI, but cannot really do it until January when she returns.  She is only here for a limited time.   She is on no other medications except some occasional Tylenol at this point  or in the past. Weight is stable at 114 pounds, blood pressure 122/58.   ASSESSMENT:  1. Alcoholic cirrhosis doing well, compensated. Her only problem has been      some thrombocytopenia. Previous serologic workup is unrevealing.   1. New diagnosis of osteoporosis. Could be related to fat malabsorption      from chronic liver disease. She is [place]. Sprue comes to mind as one      possibility or link.   PLAN:  1. CBC, CMET, alpha fetoprotein, tissue transglutaminase antibody today. I      would also check a 25 hydroxy vitamin D level and phosphorus level      related to her osteoporosis.  2. I think the Risedronate is a reasonable treatment for her and even      though there are risks of ulceration, etc., she should take it  and      watch for those complications.  3. Vaccines are appropriate as well. I see no contraindications from my      search and up to date.  4. I have talked about a possible hepatitis B vaccine. She has already had      hepatitis A vaccine. She does not really have any obvious risk factors      of hepatitis B, but that is reasonable to pursue.  5. She will see me in early 2008 in January at which point we will arrange      a followup MRI and      probably an EGD and colonoscopy. I will contact her with her lab      results and any other recommendations.       Iva Boop, MD,FACG      CEG/MedQ  DD:  02/20/2006  DT:  02/22/2006  Job #:  [id number]   cc:  Edythe Lynn, M.D.

## 2010-09-29 NOTE — Assessment & Plan Note (Signed)
[facility]                         GASTROENTEROLOGY OFFICE NOTE   NAME:Briana Johnson, Briana Johnson                      MRN:          [id number]  DATE:09/04/2006                            DOB:          1956/01/20    CHIEF COMPLAINT:  Followup of cirrhosis and abnormal MRI.  Briana Johnson  has come over from [place] again.  She has been traveling the world to  the [place], and [place] in the past  few months as well.  She is here for 2 weeks now.  Her MRI for followup  of cirrhosis on May 17, 2006 demonstrated a focal area of hyper-  enhancement in the central right liver lobe that needed followup in 3 to  6 months.  She had trace perihepatic ascites and possible left liver  dysplastic nodule is reported as well, though there was no convincing  evidence of hepatocellular carcinoma.  She also is asking for a refill  of her Sandrea Hammond that she takes and says that the [facility]  only gives her 1 or 2 at a time so that she does not overdose on it.   MEDICATIONS:  1. She is on Caltrate 600 mg plus D twice daily.  2. Boniva 1 every month.   PROBLEMS:  1. Alcoholic cirrhosis, now abstinent.  2. Thrombocytopenia.  3. Previous history of gastrointestinal bleeding, question varices,      question portal gastropathy.  She has had very small varices on      last exam of August 24, 2004.  4. Due for colonoscopy for screening purposes.  5. Osteoporosis.   PHYSICAL EXAM:  Limited to vital signs today.  Height 5 feet 1.5 inches,  weight 113 pounds, pulse 72, blood pressure 110/70.   ASSESSMENT AND PLAN:  1. Alcoholic cirrhosis, abstinent, doing well overall.  2. Abnormal MRI, question whether this represents neoplasm versus      cirrhotic changes.  3. History of gastrointestinal bleeding with small varices and portal      gastropathy.  She has done very well off of beta blocker.  4. Osteoporosis.  5. Thrombocytopenia related  to #1.   PLAN:  1. Go ahead and get her repeat dedicated MRI of the liver and I will      call with results.  2. I will give her a prescription for the Boniva to have while she is      [place].  Dispense #6, use once a month.  3. CMET, CBC, alpha-fetoprotein, and a Pro Time today.  4. [place] states in [place] and we will try to set up      her endoscopy for variceal      surveillance as well as a screening colonoscopy at that time,      although that will depend on what we see on the MRI.     Iva Boop, MD,FACG  Electronically Signed    CEG/MedQ  DD: 09/04/2006  DT: 09/04/2006  Job #: [id number]

## 2010-09-29 NOTE — Assessment & Plan Note (Signed)
[facility]                         GASTROENTEROLOGY OFFICE NOTE   NAME:PETRIEYuriko, Portales                      MRN:          [id number]  DATE:05/13/2006                            DOB:          09/09/1955    Ms. Ricciardelli returns.  We were to talk about scheduling an EGD and a  colonoscopy to follow up for varices and a screen.  She is not on any  calcium supplement though she is taking some vitamin D.  She is not sure  how much.  She is on a monthly bisphosphonate.   At this time, she says she does not have enough time while she is here  in this state to do her EGD and colonoscopy and would like to schedule  those in a few months.  We will go ahead and schedule her MRI to follow  up her liver disease and I have recommended she take Caltrate with  vitamin D to get 1200 mg calcium and 800 units of vitamin D daily and I  will call the results of her MRI and she will see him when she returns  to the states to set up her EGD and colonoscopy.     Iva Boop, MD,FACG  Electronically Signed    CEG/MedQ  DD: 05/13/2006  DT: 05/13/2006  Job #: [id number]

## 2010-10-02 ENCOUNTER — Ambulatory Visit
Admission: RE | Admit: 2010-10-02 | Discharge: 2010-10-02 | Disposition: A | Discharge status: Home / Self Care | Payer: Self-pay | Source: Ambulatory Visit | Attending: Internal Medicine | Admitting: Internal Medicine

## 2010-10-02 DIAGNOSIS — K7460 Unspecified cirrhosis of liver: Secondary | ICD-10-CM

## 2010-10-02 MED ORDER — GADOBENATE DIMEGLUMINE 529 MG/ML IV SOLN
10.0000 mL | Freq: Once | INTRAVENOUS | Status: AC | PRN
  Administered 2010-10-02: 10 mL via INTRAVENOUS

## 2010-10-03 ENCOUNTER — Encounter: Payer: Self-pay | Admitting: Internal Medicine

## 2010-10-03 NOTE — Progress Notes (Signed)
Quick Note:  Please let her know the liver still shows cirrhosis but no nodules or other signs of liver tumor or cancer. Can repeat this or other imaging ([place]) in 1 year - she can let me know as usual Please provide copy of MRI to her I still recommend a colonoscopy when she wishes to schedule  She can let me know when she is returning to [place] within the next year to arrange follow-up overall she appears to be doing well without signs of progression of liver disease  ______

## 2011-09-14 ENCOUNTER — Encounter: Payer: Self-pay | Admitting: Internal Medicine

## 2011-09-21 ENCOUNTER — Other Ambulatory Visit (INDEPENDENT_AMBULATORY_CARE_PROVIDER_SITE_OTHER): Payer: Self-pay

## 2011-09-21 ENCOUNTER — Ambulatory Visit (INDEPENDENT_AMBULATORY_CARE_PROVIDER_SITE_OTHER): Payer: Self-pay | Admitting: Internal Medicine

## 2011-09-21 ENCOUNTER — Encounter: Payer: Self-pay | Admitting: Internal Medicine

## 2011-09-21 VITALS — BP 122/70 | HR 60 | Ht 61.5 in | Wt 102.6 lb

## 2011-09-21 DIAGNOSIS — K7030 Alcoholic cirrhosis of liver without ascites: Secondary | ICD-10-CM

## 2011-09-21 LAB — CBC WITH DIFFERENTIAL/PLATELET
Basophils Absolute: 0 10*3/uL (ref 0.0–0.1)
Eosinophils Absolute: 0.1 10*3/uL (ref 0.0–0.7)
HCT: 41.7 % (ref 36.0–46.0)
Lymphs Abs: 2.5 10*3/uL (ref 0.7–4.0)
MCV: 93 fl (ref 78.0–100.0)
Monocytes Absolute: 0.4 10*3/uL (ref 0.1–1.0)
Monocytes Relative: 5.2 % (ref 3.0–12.0)
Platelets: 135 10*3/uL — ABNORMAL LOW (ref 150.0–400.0)
RDW: 13.6 % (ref 11.5–14.6)

## 2011-09-21 LAB — COMPREHENSIVE METABOLIC PANEL
ALT: 13 U/L (ref 0–35)
AST: 19 U/L (ref 0–37)
Alkaline Phosphatase: 83 U/L (ref 39–117)
Sodium: 141 mEq/L (ref 135–145)
Total Bilirubin: 0.5 mg/dL (ref 0.3–1.2)
Total Protein: 7.8 g/dL (ref 6.0–8.3)

## 2011-09-21 NOTE — Assessment & Plan Note (Addendum)
Stable without obvious problems Repeat labs, MRI  Have offered screening colonoscopy again - she declined

## 2011-09-21 NOTE — Patient Instructions (Signed)
Your physician has requested that you go to the basement for the following lab work before leaving today: CBC, CMET, Protime, AFP  You have been set up for a MRI of your liver at [facility].  Arrive at 2:45pm for a 3pm scan.

## 2011-09-22 ENCOUNTER — Encounter: Payer: Self-pay | Admitting: Internal Medicine

## 2011-09-22 NOTE — Progress Notes (Signed)
Quick Note:  Labs are fine - normal except mildly low platelets as always. Please let her know and mail her a copy of the labs   Once MR results are in will notify and send those ______

## 2011-09-22 NOTE — Progress Notes (Signed)
  Subjective:    Patient ID: CHAYCE RULLO, female    DOB: July 30, 1955, 56 y.o.   MRN: [id number]  HPI This is a delightful 56 yo woman here for annual follow-up. She continues to see me 1-2 times a year when she travels to [place] from her home in [place]. She reports no problems at this time. She says she did hemoccults vis the [facility] in [place] and they were negative. She would like annual lab follow-up and MRI to look for possible hepatomain setting of cirrhosis.   She continues to travel the world with her husband, going to [place] in January.  She is here for goddaughter's graduation from [employer]   Review of Systems No other complaints    Objective:   Physical Exam General:  NAD Eyes:   anicteric Lungs:  clear Heart:  S1S2 no rubs, murmurs or gallops Abdomen:  soft and nontender, BS+ Ext:   no edema       Assessment & Plan:

## 2011-09-24 NOTE — Progress Notes (Signed)
Pt notified of lab results per Dr. Leone Payor and copy mailed to pt.

## 2011-09-28 ENCOUNTER — Ambulatory Visit (HOSPITAL_COMMUNITY)
Admission: RE | Admit: 2011-09-28 | Discharge: 2011-09-28 | Disposition: A | Discharge status: Home / Self Care | Payer: Self-pay | Source: Ambulatory Visit | Attending: Internal Medicine | Admitting: Internal Medicine

## 2011-09-28 DIAGNOSIS — K7689 Other specified diseases of liver: Secondary | ICD-10-CM | POA: Insufficient documentation

## 2011-09-28 DIAGNOSIS — I868 Varicose veins of other specified sites: Secondary | ICD-10-CM | POA: Insufficient documentation

## 2011-09-28 DIAGNOSIS — K7030 Alcoholic cirrhosis of liver without ascites: Secondary | ICD-10-CM

## 2011-09-28 DIAGNOSIS — K7460 Unspecified cirrhosis of liver: Secondary | ICD-10-CM | POA: Insufficient documentation

## 2011-09-28 DIAGNOSIS — N281 Cyst of kidney, acquired: Secondary | ICD-10-CM | POA: Insufficient documentation

## 2011-09-28 MED ORDER — GADOBENATE DIMEGLUMINE 529 MG/ML IV SOLN
9.0000 mL | Freq: Once | INTRAVENOUS | Status: AC | PRN
  Administered 2011-09-28: 9 mL via INTRAVENOUS

## 2011-09-30 NOTE — Progress Notes (Signed)
Quick Note:  Please let her know things are ok - stable and no signs of cancer Mail her a copy or fax if desired ______

## 2012-09-24 ENCOUNTER — Encounter: Payer: Self-pay | Admitting: Internal Medicine

## 2012-09-24 ENCOUNTER — Other Ambulatory Visit (INDEPENDENT_AMBULATORY_CARE_PROVIDER_SITE_OTHER): Payer: Self-pay

## 2012-09-24 ENCOUNTER — Ambulatory Visit (INDEPENDENT_AMBULATORY_CARE_PROVIDER_SITE_OTHER): Payer: Self-pay | Admitting: Internal Medicine

## 2012-09-24 VITALS — BP 90/60 | HR 60 | Ht 61.5 in | Wt 105.2 lb

## 2012-09-24 DIAGNOSIS — K7030 Alcoholic cirrhosis of liver without ascites: Secondary | ICD-10-CM

## 2012-09-24 DIAGNOSIS — I8500 Esophageal varices without bleeding: Secondary | ICD-10-CM

## 2012-09-24 LAB — PROTIME-INR: Prothrombin Time: 12.2 s (ref 10.2–12.4)

## 2012-09-24 LAB — CBC WITH DIFFERENTIAL/PLATELET
Basophils Absolute: 0 10*3/uL (ref 0.0–0.1)
Eosinophils Relative: 1.2 % (ref 0.0–5.0)
HCT: 41.1 % (ref 36.0–46.0)
Lymphocytes Relative: 30.8 % (ref 12.0–46.0)
Lymphs Abs: 2.5 10*3/uL (ref 0.7–4.0)
Monocytes Relative: 5.5 % (ref 3.0–12.0)
Neutrophils Relative %: 61.9 % (ref 43.0–77.0)
Platelets: 142 10*3/uL — ABNORMAL LOW (ref 150.0–400.0)
RDW: 13.5 % (ref 11.5–14.6)
WBC: 8.1 10*3/uL (ref 4.5–10.5)

## 2012-09-24 LAB — COMPREHENSIVE METABOLIC PANEL
ALT: 14 U/L (ref 0–35)
AST: 20 U/L (ref 0–37)
Albumin: 4.3 g/dL (ref 3.5–5.2)
Alkaline Phosphatase: 85 U/L (ref 39–117)
BUN: 13 mg/dL (ref 6–23)
CO2: 27 mEq/L (ref 19–32)
Calcium: 9.7 mg/dL (ref 8.4–10.5)
Chloride: 105 mEq/L (ref 96–112)
Creatinine, Ser: 0.8 mg/dL (ref 0.4–1.2)
GFR: 82.71 mL/min (ref >60.00)
Glucose, Bld: 87 mg/dL (ref 70–99)
Potassium: 4.2 mEq/L (ref 3.5–5.1)
Sodium: 141 mEq/L (ref 135–145)
Total Bilirubin: 0.8 mg/dL (ref 0.3–1.2)
Total Protein: 7.5 g/dL (ref 6.0–8.3)

## 2012-09-24 NOTE — Patient Instructions (Addendum)
Your physician has requested that you go to the basement for lab work before leaving today.   You have been scheduled for an MRI at [facility]   on May 16th 2014 . Your appointment time is 10:00am . Please arrive 15 minutes prior to your appointment time for registration purposes. There is no prep for this test. However, if you have any metal in your body, have a pacemaker or defibrillator, please be sure to let your ordering physician know. This test typically takes 45 minutes to 1 hour to complete.  Please check your vaccine records from [place] and make sure you have had a Hepatitis B and pneumovax.  Thank you for choosing me and  Gastroenterology.  Iva Boop, M.D., [facility]

## 2012-09-24 NOTE — Progress Notes (Signed)
  Subjective:    Patient ID: Briana Johnson, female    DOB: 05-Aug-1955, 57 y.o.   MRN: [id number]  HPI Briana Johnson returns for an annual visit - as she is her in [place] visiting friends. She has had a good year overall and has no complaints. Planning a trip to [place] next year. She had hemoccults sent to her by the [facility] and says they were negative.  No Known Allergies Outpatient Prescriptions Prior to Visit  Medication Sig Dispense Refill  . ibandronate (BONIVA) 150 MG tablet Take 150 mg by mouth every 30 (thirty) days. Take in the morning with a full glass of water, on an empty stomach, and do not take anything else by mouth or lie down for the next 30 min.        Facility-Administered Medications Prior to Visit  Medication Dose Route Frequency Provider Last Rate Last Dose  . 0.9 %  sodium chloride infusion  500 mL Intravenous Continuous Iva Boop, MD       Past Medical History  Diagnosis Date  . Osteoporosis     osteopenia  . Esophageal varices   . Alcoholic cirrhosis   . History of alcoholism    Past Surgical History  Procedure Laterality Date  . Appendectomy    . Right breast mass    . Upper gastrointestinal endoscopy  last 09/2010,'06,4/04,8/04    trace -1+esophageal varices   History   Social History  . Marital Status: Married    Spouse Name: N/A            Occupational History  . Retired    Social History Main Topics  . Smoking status: Former Smoker    Quit date: 09/26/2002  . Smokeless tobacco: Never Used  . Alcohol Use: No  . Drug Use: No    Social History Narrative   Married to Briana Johnson.      Lives in [place]    Review of Systems As above - ok    Objective:   Physical Exam General:  NAD Eyes:   anicteric Lungs:  clear Heart:  S1S2 no rubs, murmurs or gallops Abdomen:  soft and nontender, BS+ Ext:   no edema      Assessment & Plan:  CIRRHOSIS, ALCOHOLIC  ESOPHAGEAL VARICES - Small  1. She has decided to pursue a screening MRI to  check for hepatoma 2. Labs to include CBC, CMET, AFP and INR 3. Has done well w/ Tx of small varices - B blocker caused fatigue - may need an EGD next year to recheck 4. F/U 1 year likely 5. She is immune to HAV, thinks she has had HBV vaccine as she keeps up on those due to world travel - have advised she make sure she has had HBV and pneumonia vaccines

## 2012-09-26 ENCOUNTER — Other Ambulatory Visit: Payer: Self-pay | Admitting: Internal Medicine

## 2012-09-26 ENCOUNTER — Ambulatory Visit (HOSPITAL_COMMUNITY)
Admission: RE | Admit: 2012-09-26 | Discharge: 2012-09-26 | Disposition: A | Discharge status: Home / Self Care | Payer: Self-pay | Source: Ambulatory Visit | Attending: Internal Medicine | Admitting: Internal Medicine

## 2012-09-26 DIAGNOSIS — I8500 Esophageal varices without bleeding: Secondary | ICD-10-CM | POA: Insufficient documentation

## 2012-09-26 DIAGNOSIS — K7030 Alcoholic cirrhosis of liver without ascites: Secondary | ICD-10-CM

## 2012-09-26 DIAGNOSIS — K7460 Unspecified cirrhosis of liver: Secondary | ICD-10-CM | POA: Insufficient documentation

## 2012-09-26 DIAGNOSIS — N281 Cyst of kidney, acquired: Secondary | ICD-10-CM | POA: Insufficient documentation

## 2012-09-26 MED ORDER — GADOBENATE DIMEGLUMINE 529 MG/ML IV SOLN
10.0000 mL | Freq: Once | INTRAVENOUS | Status: AC | PRN
  Administered 2012-09-26: 9 mL via INTRAVENOUS

## 2012-09-26 NOTE — Progress Notes (Signed)
Quick Note:  MRI and labs ok Mild thrombocytopenia is stable Please let her know and if she wants send copies of office note, labs and MRI report ______

## 2013-06-10 ENCOUNTER — Encounter: Payer: Self-pay | Admitting: Internal Medicine

## 2013-09-17 ENCOUNTER — Encounter: Payer: Self-pay | Admitting: Internal Medicine

## 2013-09-17 ENCOUNTER — Other Ambulatory Visit (INDEPENDENT_AMBULATORY_CARE_PROVIDER_SITE_OTHER): Payer: Self-pay

## 2013-09-17 ENCOUNTER — Ambulatory Visit (INDEPENDENT_AMBULATORY_CARE_PROVIDER_SITE_OTHER): Payer: Self-pay | Admitting: Internal Medicine

## 2013-09-17 VITALS — BP 128/62 | HR 92 | Ht 60.75 in | Wt 101.0 lb

## 2013-09-17 DIAGNOSIS — K7460 Unspecified cirrhosis of liver: Secondary | ICD-10-CM

## 2013-09-17 LAB — CBC WITH DIFFERENTIAL/PLATELET
Basophils Absolute: 0.1 10*3/uL (ref 0.0–0.1)
Basophils Relative: 0.9 % (ref 0.0–3.0)
EOS PCT: 0.8 % (ref 0.0–5.0)
Eosinophils Absolute: 0.1 10*3/uL (ref 0.0–0.7)
HCT: 40.3 % (ref 36.0–46.0)
Hemoglobin: 13.7 g/dL (ref 12.0–15.0)
Lymphocytes Relative: 33.8 % (ref 12.0–46.0)
Lymphs Abs: 2.3 10*3/uL (ref 0.7–4.0)
MCHC: 34 g/dL (ref 30.0–36.0)
MCV: 93.7 fl (ref 78.0–100.0)
MONOS PCT: 4.8 % (ref 3.0–12.0)
Monocytes Absolute: 0.3 10*3/uL (ref 0.1–1.0)
NEUTROS PCT: 59.7 % (ref 43.0–77.0)
Neutro Abs: 4 10*3/uL (ref 1.4–7.7)
PLATELETS: 144 10*3/uL — AB (ref 150.0–400.0)
RBC: 4.3 Mil/uL (ref 3.87–5.11)
RDW: 13.1 % (ref 11.5–15.5)
WBC: 6.7 10*3/uL (ref 4.0–10.5)

## 2013-09-17 LAB — COMPREHENSIVE METABOLIC PANEL
ALBUMIN: 4.2 g/dL (ref 3.5–5.2)
ALK PHOS: 72 U/L (ref 39–117)
ALT: 15 U/L (ref 0–35)
AST: 18 U/L (ref 0–37)
BUN: 10 mg/dL (ref 6–23)
CO2: 27 meq/L (ref 19–32)
Calcium: 9.4 mg/dL (ref 8.4–10.5)
Chloride: 105 mEq/L (ref 96–112)
Creatinine, Ser: 0.7 mg/dL (ref 0.4–1.2)
GFR: 85.01 mL/min (ref >60.00)
GLUCOSE: 115 mg/dL — AB (ref 70–99)
POTASSIUM: 3.9 meq/L (ref 3.5–5.1)
SODIUM: 139 meq/L (ref 135–145)
TOTAL PROTEIN: 7 g/dL (ref 6.0–8.3)
Total Bilirubin: 0.6 mg/dL (ref 0.2–1.2)

## 2013-09-17 LAB — PROTIME-INR
INR: 1.1 ratio — AB (ref 0.8–1.0)
Prothrombin Time: 12.5 s (ref 9.6–13.1)

## 2013-09-17 NOTE — Patient Instructions (Signed)
You have been scheduled for an MRI at [facility] on 09/22/13. Your appointment time is 9:00pm. Please arrive 15 minutes prior to your appointment time for registration purposes.  Do not eat after 6:00pm. If you have any metal in your body, have a pacemaker or defibrillator, please be sure to let your ordering physician know. This test typically takes 45 minutes to 1 hour to complete.   Your physician has requested that you go to the basement for the following lab work before leaving today: CBC/diff, CMET, PT/INR , AFP   I appreciate the opportunity to care for you.

## 2013-09-17 NOTE — Progress Notes (Signed)
         Subjective:    Patient ID: Briana Johnson, female    DOB: [birth year], 58 y.o.   MRN: [id number]  HPI The patient is here for annual followup of her cirrhosis. She denies any problems she would like her annual MRI and lab testing. She feels like she is doing well overall. Medications, allergies, past medical history, past surgical history, family history and social history are reviewed and updated in the EMR.   Review of Systems As above    Objective:   Physical Exam General:  NAD Eyes:   anicteric Lungs:  clear Heart:  S1S2 no rubs, murmurs or gallops Abdomen:  soft and nontender, BS+ no hepatosplenomegaly or mass Ext:   no edema     Assessment & Plan:   1. Cirrhosis    1. Alpha-fetoprotein, CBC, protime INR and comprehensive metabolic panel 2. MRI of the liver with and without contrast given her history of nodules and her concern for detecting development of hepatocellular carcinoma

## 2013-09-18 NOTE — Progress Notes (Signed)
Quick Note:  Results given to patient ______ 

## 2013-09-22 ENCOUNTER — Ambulatory Visit (HOSPITAL_COMMUNITY)
Admission: RE | Admit: 2013-09-22 | Discharge: 2013-09-22 | Disposition: A | Discharge status: Home / Self Care | Payer: Self-pay | Source: Ambulatory Visit | Attending: Internal Medicine | Admitting: Internal Medicine

## 2013-09-22 DIAGNOSIS — K7689 Other specified diseases of liver: Secondary | ICD-10-CM | POA: Insufficient documentation

## 2013-09-22 DIAGNOSIS — K7030 Alcoholic cirrhosis of liver without ascites: Secondary | ICD-10-CM | POA: Insufficient documentation

## 2013-09-22 DIAGNOSIS — N281 Cyst of kidney, acquired: Secondary | ICD-10-CM | POA: Insufficient documentation

## 2013-09-22 DIAGNOSIS — K7460 Unspecified cirrhosis of liver: Secondary | ICD-10-CM

## 2013-09-22 MED ORDER — GADOXETATE DISODIUM 0.25 MMOL/ML IV SOLN
5.0000 mL | Freq: Once | INTRAVENOUS | Status: AC | PRN
  Administered 2013-09-22: 5 mL via INTRAVENOUS

## 2013-09-23 NOTE — Progress Notes (Signed)
Quick Note:  MRI is stable - cirrhosis but no signs of liver tumors/cancer We can repeat in 1 year Let her know - provide copies of this report + labs and husband's CT and labs Ask how husband is doing also (has diverticulitis0 ______

## 2013-10-02 ENCOUNTER — Encounter: Payer: Self-pay | Admitting: Internal Medicine

## 2013-10-02 NOTE — Telephone Encounter (Signed)
Error

## 2014-08-19 ENCOUNTER — Telehealth: Payer: Self-pay

## 2014-08-19 DIAGNOSIS — K7030 Alcoholic cirrhosis of liver without ascites: Secondary | ICD-10-CM

## 2014-08-19 NOTE — Telephone Encounter (Signed)
-----   Message from Iva Booparl E Gessner, MD sent at 08/18/2014  8:51 AM EDT ----- Sure - like we have done and we can arrange the usual labs - CBC, CMET, AFP and PT/INR  Dx cirrhosis ----- Message -----    From: Annett FabianSheri L Jones, RN    Sent: 08/17/2014  11:10 AM      To: Iva Booparl E Gessner, MD  Coming in from [place] in May wants an MRI when she is here ok to set up.  Has a follow up on 5/17

## 2014-08-19 NOTE — Telephone Encounter (Signed)
Patient is scheduled for 9:00 09/29/14 at [facility] She will call one day next week from [place] to get the appt details

## 2014-08-24 NOTE — Telephone Encounter (Signed)
Patient called and is notified of MRI date and to come for labs prior to the appt on 09/28/14

## 2014-09-21 ENCOUNTER — Other Ambulatory Visit (INDEPENDENT_AMBULATORY_CARE_PROVIDER_SITE_OTHER): Payer: Self-pay

## 2014-09-21 DIAGNOSIS — K7030 Alcoholic cirrhosis of liver without ascites: Secondary | ICD-10-CM

## 2014-09-21 LAB — CBC WITH DIFFERENTIAL/PLATELET
Basophils Absolute: 0 K/uL (ref 0.0–0.1)
Basophils Relative: 0.5 % (ref 0.0–3.0)
Eosinophils Absolute: 0.1 K/uL (ref 0.0–0.7)
Eosinophils Relative: 2 % (ref 0.0–5.0)
HCT: 40.3 % (ref 36.0–46.0)
Hemoglobin: 14.1 g/dL (ref 12.0–15.0)
Lymphocytes Relative: 40.3 % (ref 12.0–46.0)
Lymphs Abs: 2.4 K/uL (ref 0.7–4.0)
MCHC: 34.9 g/dL (ref 30.0–36.0)
MCV: 90.3 fl (ref 78.0–100.0)
Monocytes Absolute: 0.4 K/uL (ref 0.1–1.0)
Monocytes Relative: 5.9 % (ref 3.0–12.0)
Neutro Abs: 3.1 K/uL (ref 1.4–7.7)
Neutrophils Relative %: 51.3 % (ref 43.0–77.0)
Platelets: 147 K/uL — ABNORMAL LOW (ref 150.0–400.0)
RBC: 4.47 Mil/uL (ref 3.87–5.11)
RDW: 13 % (ref 11.5–15.5)
WBC: 5.9 K/uL (ref 4.0–10.5)

## 2014-09-21 LAB — COMPREHENSIVE METABOLIC PANEL
ALBUMIN: 4.1 g/dL (ref 3.5–5.2)
ALK PHOS: 78 U/L (ref 39–117)
ALT: 11 U/L (ref 0–35)
AST: 17 U/L (ref 0–37)
BILIRUBIN TOTAL: 0.6 mg/dL (ref 0.2–1.2)
BUN: 10 mg/dL (ref 6–23)
CO2: 28 meq/L (ref 19–32)
Calcium: 9.6 mg/dL (ref 8.4–10.5)
Chloride: 106 mEq/L (ref 96–112)
Creatinine, Ser: 0.7 mg/dL (ref 0.40–1.20)
GFR: 90.34 mL/min (ref >60.00)
GLUCOSE: 77 mg/dL (ref 70–99)
POTASSIUM: 3.9 meq/L (ref 3.5–5.1)
SODIUM: 140 meq/L (ref 135–145)
TOTAL PROTEIN: 7.2 g/dL (ref 6.0–8.3)

## 2014-09-21 LAB — PROTIME-INR
INR: 1.1 ratio — ABNORMAL HIGH (ref 0.8–1.0)
Prothrombin Time: 12.3 s (ref 9.6–13.1)

## 2014-09-22 LAB — AFP TUMOR MARKER: AFP-Tumor Marker: 2.1 ng/mL (ref <6.1)

## 2014-09-24 NOTE — Progress Notes (Signed)
Quick Note:  Labs ok Will review 5/17 REV ______

## 2014-09-28 ENCOUNTER — Ambulatory Visit (INDEPENDENT_AMBULATORY_CARE_PROVIDER_SITE_OTHER): Payer: Self-pay | Admitting: Internal Medicine

## 2014-09-28 ENCOUNTER — Encounter: Payer: Self-pay | Admitting: Internal Medicine

## 2014-09-28 VITALS — BP 118/60 | HR 70 | Ht 60.75 in | Wt 102.0 lb

## 2014-09-28 DIAGNOSIS — I8500 Esophageal varices without bleeding: Secondary | ICD-10-CM

## 2014-09-28 DIAGNOSIS — K7460 Unspecified cirrhosis of liver: Secondary | ICD-10-CM

## 2014-09-28 NOTE — Patient Instructions (Signed)
Today we are giving you a copy of your lab work that has been done this year.   We will be in touch with your MRI results.    Great to see you.   I appreciate the opportunity to care for you. Stan Headarl Gessner, M.D., [facility]

## 2014-09-28 NOTE — Assessment & Plan Note (Addendum)
Doing well overall by labs and MR has returned and shows cirrhosis and some esophgeal varices - these were trace-flat 2012 EGD Had fatigue w/ B blocker so stopped. Could make sense to repeat an EGD Other than sl low PLT she is compensated and Child's A Consider AFP next time also

## 2014-09-28 NOTE — Progress Notes (Signed)
Subjective:  Cc: cirrhosis  Patient ID: Briana Johnson, female    DOB: 1955/11/04, 59 y.o.   MRN: [id number]  HPI Delightful middle-aged [place] woman ([place]here for follow-up. Last seen 1 year ago. Does not have any complaints. Doing well and only medication is for osteoporosis. Says had negative hemoccults through [facility]. She traveled to [place] in Feb 2016 and had a great trip. She has done very well over the years since a 2004 hspitalization with variceal bleed and encephalopathy - and has abstained from EtOH. Has had some dysplastic nodules on MR at times but none in a while Had called and had labs before this visit and MR is 5/17.  No Known Allergies Outpatient Prescriptions Prior to Visit  Medication Sig Dispense Refill  . ibandronate (BONIVA) 150 MG tablet Take 150 mg by mouth every 30 (thirty) days. Take in the morning with a full glass of water, on an empty stomach, and do not take anything else by mouth or lie down for the next 30 min.      No facility-administered medications prior to visit.   Past Medical History  Diagnosis Date  . Osteoporosis     osteopenia  . Esophageal varices   . Alcoholic cirrhosis   . History of alcoholism    Past Surgical History  Procedure Laterality Date  . Appendectomy    . Right breast mass    . Upper gastrointestinal endoscopy  last 09/2010,'06,4/04,8/04    trace -1+esophageal varices   History   Social History  . Marital Status: Married    Spouse Name: N/A  . Number of Children: 1  . Years of Education: N/A   Occupational History  . Retired    Social History Main Topics  . Smoking status: Former Smoker    Quit date: 09/26/2002  . Smokeless tobacco: Never Used  . Alcohol Use: No  . Drug Use: No    Social History Narrative   Married to [place].   No children   Lives in [place]   Family History  Problem Relation Age of Onset  . Stomach cancer Father   . Colon cancer       Review of  Systems As per HPI    Objective:   Physical Exam @BP  118/60 mmHg  Pulse 70  Ht 5' 0.75" (1.543 m)  Wt 102 lb (46.267 kg)  BMI 19.43 kg/m2@  General:  Well-developed, well-nourished and in no acute distress Eyes:  anicteric. Lungs: Clear to auscultation bilaterally. Heart:  S1S2, no rubs, murmurs, gallops. Abdomen:  soft, non-tender, no hepatosplenomegaly, hernia, or mass and BS+.  Extremities:   no edema, cyanosis or clubbing Skin   no rash and no stigmata of liver disease. Neuro:  A&O x 3.  Psych:  appropriate mood and  Affect.   Data Reviewed: Lab Results  Component Value Date   WBC 5.9 09/21/2014   HGB 14.1 09/21/2014   HCT 40.3 09/21/2014   MCV 90.3 09/21/2014   PLT 147.0* 09/21/2014     Chemistry      Component Value Date/Time   NA 140 09/21/2014 0910   K 3.9 09/21/2014 0910   CL 106 09/21/2014 0910   CO2 28 09/21/2014 0910   BUN 10 09/21/2014 0910   CREATININE 0.70 09/21/2014 0910      Component Value Date/Time   CALCIUM 9.6 09/21/2014 0910   ALKPHOS 78 09/21/2014 0910   AST 17 09/21/2014 0910   ALT 11 09/21/2014 0910  BILITOT 0.6 09/21/2014 [id number]     Lab Results  Component Value Date   INR 1.1* 09/21/2014   INR 1.1* 09/17/2013   INR 1.2* 09/24/2012       Assessment & Plan:   .Hepatic cirrhosis Doing well overall by labs and MR has returned and shows cirrhosis and some esophgeal varices - these were trace-flat 2012 EGD Had fatigue w/ B blocker so stopped. Could make sense to repeat an EGD Other than sl low PLT she is compensated and Child's A Consider AFP next time also    Esophageal varices Consider EGD next visit

## 2014-09-29 ENCOUNTER — Ambulatory Visit (HOSPITAL_COMMUNITY)
Admission: RE | Admit: 2014-09-29 | Discharge: 2014-09-29 | Disposition: A | Discharge status: Home / Self Care | Payer: Self-pay | Source: Ambulatory Visit | Attending: Internal Medicine | Admitting: Internal Medicine

## 2014-09-29 ENCOUNTER — Encounter: Payer: Self-pay | Admitting: Internal Medicine

## 2014-09-29 DIAGNOSIS — K7030 Alcoholic cirrhosis of liver without ascites: Secondary | ICD-10-CM | POA: Insufficient documentation

## 2014-09-29 MED ORDER — GADOXETATE DISODIUM 0.25 MMOL/ML IV SOLN
5.0000 mL | Freq: Once | INTRAVENOUS | Status: AC | PRN
  Administered 2014-09-29: 5 mL via INTRAVENOUS

## 2014-09-29 NOTE — Progress Notes (Signed)
Quick Note:  Mri shows cirrhosis as before but no signs of cancer Please print me a copy so I can send to her  ______

## 2014-09-29 NOTE — Assessment & Plan Note (Signed)
Consider EGD next visit

## 2014-10-01 ENCOUNTER — Telehealth: Payer: Self-pay | Admitting: Internal Medicine

## 2014-10-01 NOTE — Telephone Encounter (Signed)
Patient notified of results.

## 2015-09-06 ENCOUNTER — Encounter: Payer: Self-pay | Admitting: Internal Medicine

## 2015-09-06 ENCOUNTER — Ambulatory Visit (INDEPENDENT_AMBULATORY_CARE_PROVIDER_SITE_OTHER): Payer: Self-pay | Admitting: Internal Medicine

## 2015-09-06 ENCOUNTER — Other Ambulatory Visit (INDEPENDENT_AMBULATORY_CARE_PROVIDER_SITE_OTHER): Payer: Self-pay

## 2015-09-06 VITALS — BP 102/58 | HR 64 | Ht 61.5 in | Wt 99.0 lb

## 2015-09-06 DIAGNOSIS — K7460 Unspecified cirrhosis of liver: Secondary | ICD-10-CM

## 2015-09-06 LAB — COMPREHENSIVE METABOLIC PANEL
ALBUMIN: 4.4 g/dL (ref 3.5–5.2)
ALT: 10 U/L (ref 0–35)
AST: 17 U/L (ref 0–37)
Alkaline Phosphatase: 71 U/L (ref 39–117)
BILIRUBIN TOTAL: 0.6 mg/dL (ref 0.2–1.2)
BUN: 9 mg/dL (ref 6–23)
CALCIUM: 9.6 mg/dL (ref 8.4–10.5)
CHLORIDE: 106 meq/L (ref 96–112)
CO2: 28 meq/L (ref 19–32)
Creatinine, Ser: 0.67 mg/dL (ref 0.40–1.20)
GFR: 94.72 mL/min (ref >60.00)
Glucose, Bld: 101 mg/dL — ABNORMAL HIGH (ref 70–99)
Potassium: 4.4 mEq/L (ref 3.5–5.1)
Sodium: 142 mEq/L (ref 135–145)
Total Protein: 7.3 g/dL (ref 6.0–8.3)

## 2015-09-06 LAB — CBC WITH DIFFERENTIAL/PLATELET
BASOS ABS: 0.1 10*3/uL (ref 0.0–0.1)
Basophils Relative: 1 % (ref 0.0–3.0)
Eosinophils Absolute: 0.1 10*3/uL (ref 0.0–0.7)
Eosinophils Relative: 2.1 % (ref 0.0–5.0)
HCT: 39.2 % (ref 36.0–46.0)
Hemoglobin: 13.4 g/dL (ref 12.0–15.0)
LYMPHS PCT: 33.5 % (ref 12.0–46.0)
Lymphs Abs: 2.3 10*3/uL (ref 0.7–4.0)
MCHC: 34.1 g/dL (ref 30.0–36.0)
MCV: 92 fl (ref 78.0–100.0)
MONOS PCT: 5.7 % (ref 3.0–12.0)
Monocytes Absolute: 0.4 10*3/uL (ref 0.1–1.0)
NEUTROS ABS: 4 10*3/uL (ref 1.4–7.7)
NEUTROS PCT: 57.7 % (ref 43.0–77.0)
Platelets: 170 10*3/uL (ref 150.0–400.0)
RBC: 4.26 Mil/uL (ref 3.87–5.11)
RDW: 13.4 % (ref 11.5–15.5)
WBC: 6.9 10*3/uL (ref 4.0–10.5)

## 2015-09-06 LAB — PROTIME-INR
INR: 1.1 ratio — AB (ref 0.8–1.0)
Prothrombin Time: 11.8 s (ref 9.6–13.1)

## 2015-09-06 NOTE — Patient Instructions (Signed)
  Your physician has requested that you go to the basement for the following lab work before leaving today: CBC/diff, CMET, PT/INR, AFP    You have been scheduled for an MRI at [facility] on 09/08/15. Your appointment time is 7:00AM. Please arrive 15 minutes prior to your appointment time for registration purposes. Please make certain not to have anything to eat or drink 6 hours prior to your test. In addition, if you have any metal in your body, have a pacemaker or defibrillator, please be sure to let your ordering physician know. This test typically takes 45 minutes to 1 hour to complete.    I appreciate the opportunity to care for you.

## 2015-09-06 NOTE — Progress Notes (Signed)
   Subjective:    Patient ID: Briana Johnson, female    DOB: [birth year], 60 y.o.   MRN: [id number] Chief complaint: Annual follow-up cirrhosis HPI Patient is here for follow-up. She is doing well, she is visiting the country and she does every time about this point in the year. She feels well but would like follow-up of her cirrhosis. She continues to abstain from alcohol. She has no significant complaints at this time. She [facility] and [place]. She and her husband went to [place] in the past year on holiday. No Known Allergies Outpatient Prescriptions Prior to Visit  Medication Sig Dispense Refill  . ibandronate (BONIVA) 150 MG tablet Take 150 mg by mouth every 30 (thirty) days. Take in the morning with a full glass of water, on an empty stomach, and do not take anything else by mouth or lie down for the next 30 min.      No facility-administered medications prior to visit.   Past Medical History  Diagnosis Date  . Osteoporosis     osteopenia  . Esophageal varices (HCC)   . Alcoholic cirrhosis (HCC)   . History of alcoholism [facility](HCC)    Past Surgical History  Procedure Laterality Date  . Appendectomy    . Right breast mass    . Upper gastrointestinal endoscopy  last 09/2010,'06,4/04,8/04    trace -1+esophageal varices   Social History   Social History  . Marital Status: Married    Spouse Name: N/A  . Number of Children: N/A  . Years of Education: N/A   Occupational History  . Retired    Social History Main Topics  . Smoking status: Former Smoker    Quit date: 09/26/2002  . Smokeless tobacco: Never Used  . Alcohol Use: No  . Drug Use: No  . Sexual Activity: Not Asked   Other Topics Concern  . None   Social History Narrative   Married to [place].   No children   Lives in [place]   Family History  Problem Relation Age of Onset  . Stomach cancer Father   . Colon cancer     Review of Systems As above    Objective:   Physical Exam @BP  102/58 mmHg  Pulse 64  Ht 5' 1.5" (1.562 m)  Wt 99 lb (44.906 kg)  BMI 18.41 kg/m2@  General:  NAD Eyes:   anicteric Lungs:  clear Heart:: S1S2 no rubs, murmurs or gallops Abdomen:  soft and nontender, BS+ no Barrett's but him a mass Ext:   no edema, cyanosis or clubbing Skin:  No stigmata of chronic liver disease     Assessment & Plan:   1. Cirrhosis of liver without ascites, unspecified hepatic cirrhosis type (HCC)    CBC, metabolic panel alpha-fetoprotein and INR will be checked today. She requests a repeat MRI, we have been doing that and that is not unreasonable though she's had stability over many years if the most sensitive way to screen for liver cancer. We'll proceed with that and let her know. She will see me in a year sooner if needed most likely.  I have not really screened for follow-up on her trace varices. She originally had varices that were bleeding when she presented years ago, her varices have been flat ever since. We could consider doing that in the future if she desires. I feel she is very low risk for recurrent bleeding.

## 2015-09-07 ENCOUNTER — Telehealth: Payer: Self-pay | Admitting: Internal Medicine

## 2015-09-07 LAB — AFP TUMOR MARKER: AFP TUMOR MARKER: 2.5 ng/mL (ref <6.1)

## 2015-09-07 NOTE — Telephone Encounter (Signed)
[facility] calling requesting Dr. Leone PayorGessner sign off on the MRI order that is in epic. Dr. Leone PayorGessner notified.

## 2015-09-08 ENCOUNTER — Ambulatory Visit (HOSPITAL_COMMUNITY)
Admission: RE | Admit: 2015-09-08 | Discharge: 2015-09-08 | Disposition: A | Discharge status: Home / Self Care | Payer: Self-pay | Source: Ambulatory Visit | Attending: Internal Medicine | Admitting: Internal Medicine

## 2015-09-08 DIAGNOSIS — K7460 Unspecified cirrhosis of liver: Secondary | ICD-10-CM | POA: Insufficient documentation

## 2015-09-08 DIAGNOSIS — K766 Portal hypertension: Secondary | ICD-10-CM | POA: Insufficient documentation

## 2015-09-08 MED ORDER — GADOXETATE DISODIUM 0.25 MMOL/ML IV SOLN
5.0000 mL | Freq: Once | INTRAVENOUS | Status: AC | PRN
  Administered 2015-09-08: 5 mL via INTRAVENOUS

## 2015-09-08 NOTE — Progress Notes (Signed)
Quick Note:  See mri result note Will cc patient Also cc my progress note to her ______

## 2015-09-08 NOTE — Progress Notes (Signed)
Quick Note:  MRI is ok - stable cirrhosis and no tumors Please send her a copy of this report and her recent labs which were also ok  Looking over everything only other recommendation I have is to consider having an EGD to see how big her esophageal varices are - they were small before and she has not had bleeding in a long time so I do not feel strongly that she needs this though per expert guidelines this is appropriate. We could consider that the next time she comes. ______

## 2017-03-20 ENCOUNTER — Ambulatory Visit: Payer: Self-pay | Admitting: Internal Medicine

## 2017-03-20 ENCOUNTER — Encounter: Payer: Self-pay | Admitting: Internal Medicine

## 2017-03-20 ENCOUNTER — Other Ambulatory Visit (INDEPENDENT_AMBULATORY_CARE_PROVIDER_SITE_OTHER): Payer: Self-pay

## 2017-03-20 ENCOUNTER — Ambulatory Visit (INDEPENDENT_AMBULATORY_CARE_PROVIDER_SITE_OTHER): Payer: Self-pay | Admitting: Internal Medicine

## 2017-03-20 DIAGNOSIS — K7460 Unspecified cirrhosis of liver: Secondary | ICD-10-CM

## 2017-03-20 LAB — CBC WITH DIFFERENTIAL/PLATELET
BASOS ABS: 0.1 10*3/uL (ref 0.0–0.1)
Basophils Relative: 0.9 % (ref 0.0–3.0)
EOS ABS: 0.1 10*3/uL (ref 0.0–0.7)
Eosinophils Relative: 1.6 % (ref 0.0–5.0)
HEMATOCRIT: 39.5 % (ref 36.0–46.0)
Hemoglobin: 13.2 g/dL (ref 12.0–15.0)
LYMPHS PCT: 35.1 % (ref 12.0–46.0)
Lymphs Abs: 2.2 10*3/uL (ref 0.7–4.0)
MCHC: 33.5 g/dL (ref 30.0–36.0)
MCV: 94.2 fl (ref 78.0–100.0)
Monocytes Absolute: 0.4 10*3/uL (ref 0.1–1.0)
Monocytes Relative: 5.6 % (ref 3.0–12.0)
NEUTROS ABS: 3.5 10*3/uL (ref 1.4–7.7)
NEUTROS PCT: 56.8 % (ref 43.0–77.0)
PLATELETS: 158 10*3/uL (ref 150.0–400.0)
RBC: 4.19 Mil/uL (ref 3.87–5.11)
RDW: 13.6 % (ref 11.5–15.5)
WBC: 6.2 10*3/uL (ref 4.0–10.5)

## 2017-03-20 LAB — COMPREHENSIVE METABOLIC PANEL
ALBUMIN: 4.3 g/dL (ref 3.5–5.2)
ALK PHOS: 70 U/L (ref 39–117)
ALT: 10 U/L (ref 0–35)
AST: 16 U/L (ref 0–37)
BILIRUBIN TOTAL: 0.7 mg/dL (ref 0.2–1.2)
BUN: 10 mg/dL (ref 6–23)
CO2: 26 mEq/L (ref 19–32)
Calcium: 9.5 mg/dL (ref 8.4–10.5)
Chloride: 107 mEq/L (ref 96–112)
Creatinine, Ser: 0.71 mg/dL (ref 0.40–1.20)
GFR: 88.15 mL/min (ref >60.00)
Glucose, Bld: 99 mg/dL (ref 70–99)
POTASSIUM: 3.9 meq/L (ref 3.5–5.1)
Sodium: 141 mEq/L (ref 135–145)
TOTAL PROTEIN: 7.3 g/dL (ref 6.0–8.3)

## 2017-03-20 LAB — PROTIME-INR
INR: 1.1 ratio — AB (ref 0.8–1.0)
Prothrombin Time: 11.8 s (ref 9.6–13.1)

## 2017-03-20 NOTE — Progress Notes (Signed)
   Briana Johnson 61 y.o. 10/10/1955 [id number]  Assessment & Plan:   Hepatic cirrhosis Stable MR and labs will be done this week and I will get her the results.  We exchanged cell phone number and I have her email.  She cannot access my chart because she does not have a Orthoptistocial Security number.     Subjective:   Chief Complaint: Follow-up of cirrhosis annual visit  HPI The patient is here for follow-up, she is visiting [place] again.  She is here for a wedding.  She tells me that she and Briana Johnson have sold their [place] house so have no address here anymore her physician [facility] [place] said she had been on [place] for 7 years and it was time to stop it.  She will get a follow-up bone density in 3 years.  She feels well without any complaints but would like I have her routine follow-up MRI and laboratory testing regarding her history of alcoholic liver disease and cirrhosis without complications. No Known Allergies No outpatient medications have been marked as taking for the 03/20/17 encounter (Office Visit) with Briana Johnson, Briana E, MD.   Past Medical History:  Diagnosis Date  . Alcoholic cirrhosis (HCC)   . Esophageal varices (HCC)   . History of alcoholism (HCC)   . Osteoporosis    osteopenia   Past Surgical History:  Procedure Laterality Date  . APPENDECTOMY    . right breast mass    . UPPER GASTROINTESTINAL ENDOSCOPY  last 09/2010,'06,4/04,8/04   trace -1+esophageal varices   Social History   Occupational History  . Occupation: Retired  Tobacco Use  . Smoking status: Former Smoker    Last attempt to quit: 09/26/2002    Years since quitting: 14.4  . Smokeless tobacco: Never Used  Substance and Sexual Activity  . Alcohol use: No    Alcohol/week: 0.0 oz  . Drug use: No            Social History Narrative   Married to Briana Johnson.   No children   Lives in [place]   family history includes Colon cancer in her unknown relative; Stomach  cancer in her father.   Review of Systems As above  Objective:   Physical Exam @BP  130/68   Pulse 64   Ht 5\' 1"  (1.549 m)   Wt 98 lb (44.5 kg)   BMI 18.52 kg/m @  General:  NAD Eyes:   anicteric Lungs:  clear Heart::  S1S2 no rubs, murmurs or gallops Abdomen:  soft and nontender, BS+ Ext:   no edema, cyanosis or clubbing     15 minutes time spent with patient > half in counseling coordination of care

## 2017-03-20 NOTE — Assessment & Plan Note (Addendum)
Stable MR and labs will be done this week and I will get her the results.  We exchanged cell phone number and I have her email.  She cannot access my chart because she does not have a Orthoptistocial Security number.

## 2017-03-20 NOTE — Patient Instructions (Addendum)
Your physician has requested that you go to the basement for the lab work before leaving today.   You have been scheduled for an MRI at [facility] on 03/22/17. Your appointment time is 9:00AM. Please arrive 15 minutes prior to your appointment time for registration purposes. Please make certain not to have anything to eat or drink 4 hours prior to your test. In addition, if you have any metal in your body, have a pacemaker or defibrillator, please be sure to let your ordering physician know. This test typically takes 45 minutes to 1 hour to complete. Should you need to reschedule, please call [phone] to do so.   The MRI is located at [address] [place] [id number].    I appreciate the opportunity to care for you. Stan Headarl Gessner, MD, [facility]

## 2017-03-21 LAB — AFP TUMOR MARKER: AFP-Tumor Marker: 2.7 ng/mL

## 2017-03-22 ENCOUNTER — Ambulatory Visit (HOSPITAL_COMMUNITY)
Admission: RE | Admit: 2017-03-22 | Discharge: 2017-03-22 | Disposition: A | Discharge status: Home / Self Care | Payer: Self-pay | Source: Ambulatory Visit | Attending: Internal Medicine | Admitting: Internal Medicine

## 2017-03-22 DIAGNOSIS — I700 Atherosclerosis of aorta: Secondary | ICD-10-CM | POA: Insufficient documentation

## 2017-03-22 DIAGNOSIS — K7460 Unspecified cirrhosis of liver: Secondary | ICD-10-CM | POA: Insufficient documentation

## 2017-03-22 DIAGNOSIS — K766 Portal hypertension: Secondary | ICD-10-CM | POA: Insufficient documentation

## 2017-03-22 DIAGNOSIS — M5137 Other intervertebral disc degeneration, lumbosacral region: Secondary | ICD-10-CM | POA: Insufficient documentation

## 2017-03-22 DIAGNOSIS — I8500 Esophageal varices without bleeding: Secondary | ICD-10-CM | POA: Insufficient documentation

## 2017-03-22 MED ORDER — GADOBENATE DIMEGLUMINE 529 MG/ML IV SOLN
10.0000 mL | Freq: Once | INTRAVENOUS | Status: AC | PRN
  Administered 2017-03-22: 10 mL via INTRAVENOUS

## 2017-03-22 NOTE — Progress Notes (Signed)
Labs look great  Please  1) Notify her by cell phone 2) Print and leave at front desk so she can have them and also MRI report

## 2017-03-22 NOTE — Progress Notes (Signed)
MRI shows changes of cirrhosis as before  Please notify her by cell phone and leave report at front desk for her to pick up (with lab results also)

## 2018-04-26 IMAGING — MR MR ABDOMEN WO/W CM
11 of 19 series · 22 of 48 positions shown · IV contrast (multihance)
Comparison: 09/08/2015 [DATE] cc MultiHance

CLINICAL DATA: Hepatic cirrhosis and portal venous hypertension,
follow up surveillance.

EXAM:
MRI ABDOMEN WITHOUT AND WITH CONTRAST
TECHNIQUE: Multiplanar multisequence MR imaging of the abdomen was performed
both before and after the administration of intravenous contrast.
CONTRAST:  10mL MULTIHANCE GADOBENATE DIMEGLUMINE 529 MG/ML IV SOLN

[Series 3: T2 fat-sat · axial · 5.0mm · 0.78mm/px · z∈[-54,+161]mm · 2 of 44 slices shown]
[im 1/44]
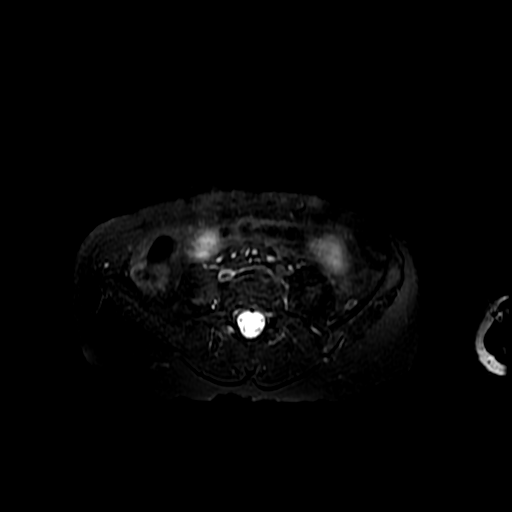
[im 44/44]
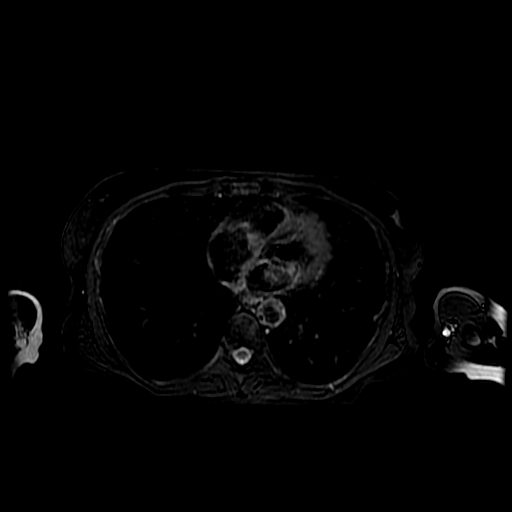

[Series 4: DWI b500 · axial · 6.0mm · 1.48mm/px · z∈[-59,+159]mm · 3 of 57 slices shown]
[im 1/57]
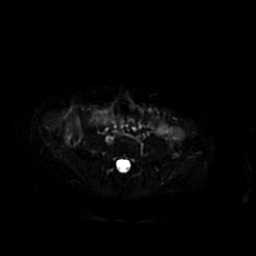
[im 29/57]
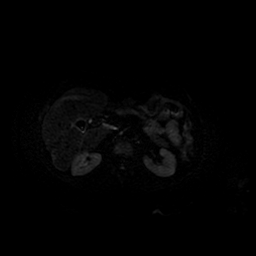
[im 57/57]
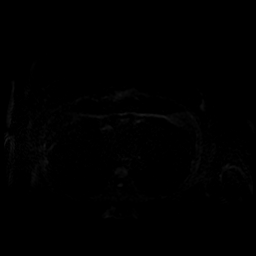

[Series 6: ax dualecho · axial · 5.0mm · 0.78mm/px · z∈[-62,+143]mm · 4 of 84 slices shown]
[im 1/84]
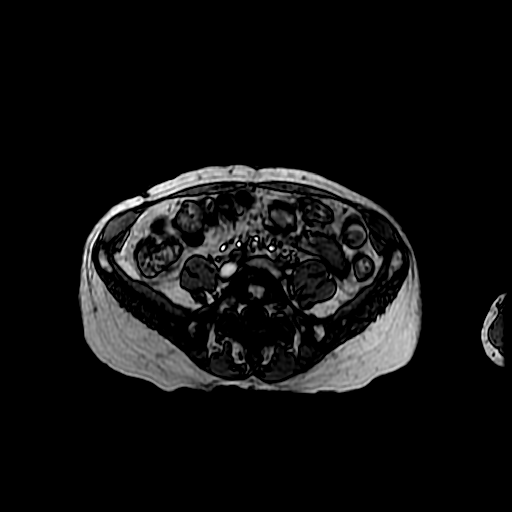
[im 28/84]
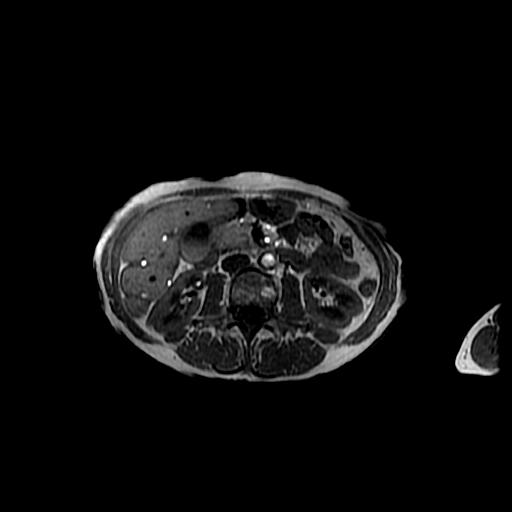
[im 56/84]
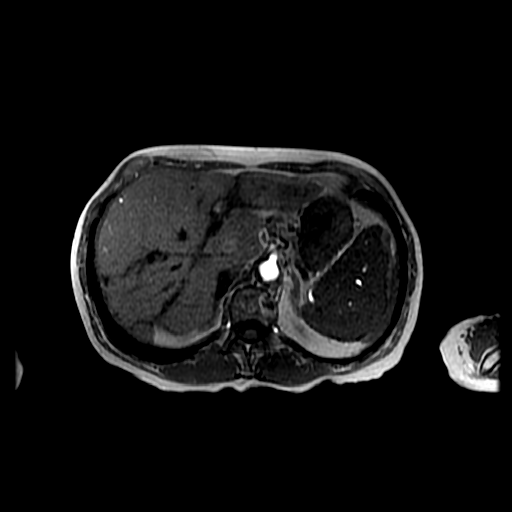
[im 84/84]
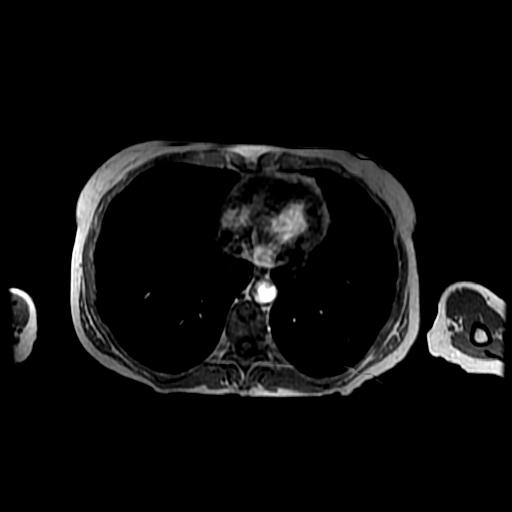

[Series 7: T2 · axial · 5.0mm · 0.78mm/px · z∈[-47,+158]mm · 2 of 41 slices shown (1 of 2)]
[im 1/41]
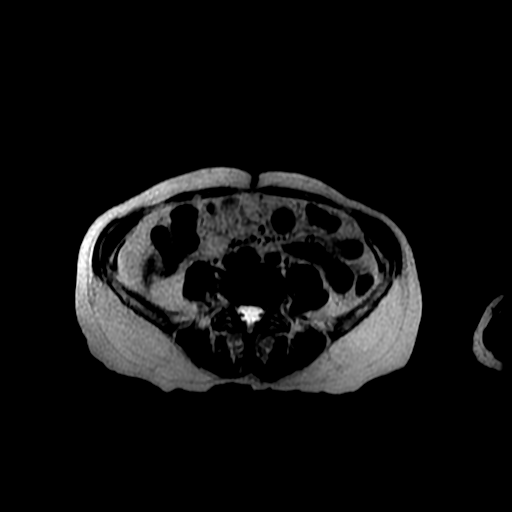
[im 41/41]
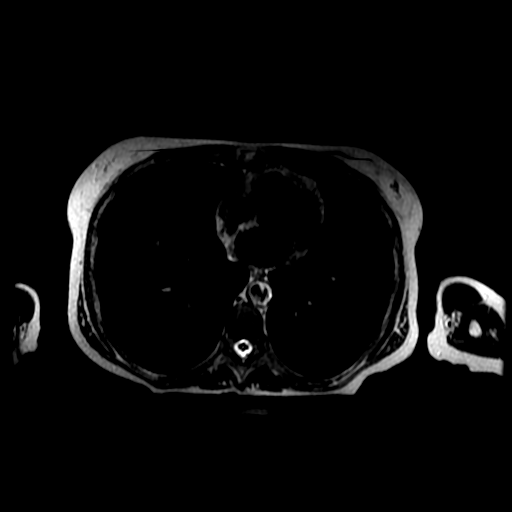

[Series 8: T2 · coronal · 5.0mm · 0.78mm/px · 1 of 34 slices shown (2 of 2)]
[im 1/34]
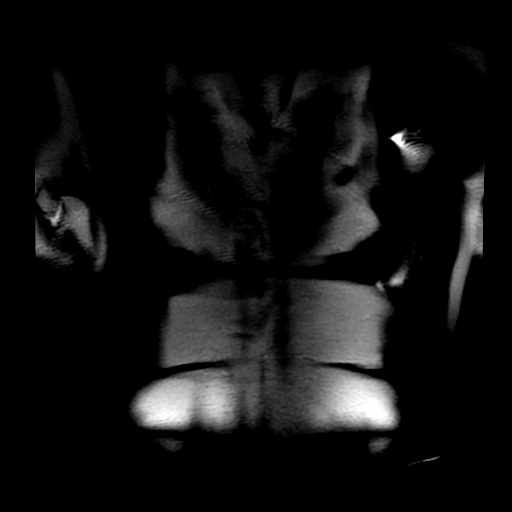

[Series 9: bSSFP · axial · 5.0mm · 0.78mm/px · 1 of 42 slices shown]
[im 1/42]
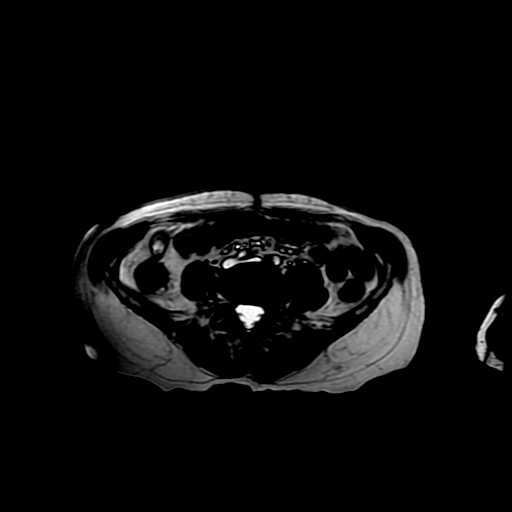

[Series 400: DWI · axial · 6.0mm · 1.48mm/px · 1 of 29 slices shown (1 of 2)]
[im 1/29]
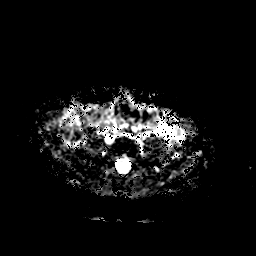

[Series 401: DWI · axial · 6.0mm · 1.48mm/px · 1 of 29 slices shown (2 of 2)]
[im 1/29]
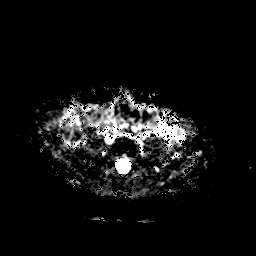

[Series 1000: T1 dynamic · axial · 5.0mm · 0.78mm/px · z∈[-83,+155]mm · 3 of 96 slices shown (1 of 3)]
[im 1/96]
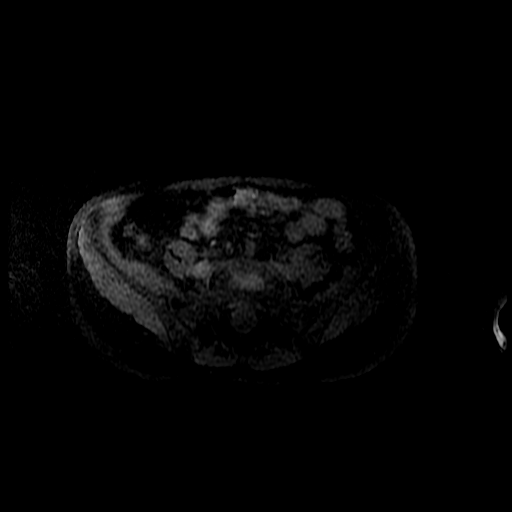
[im 48/96]
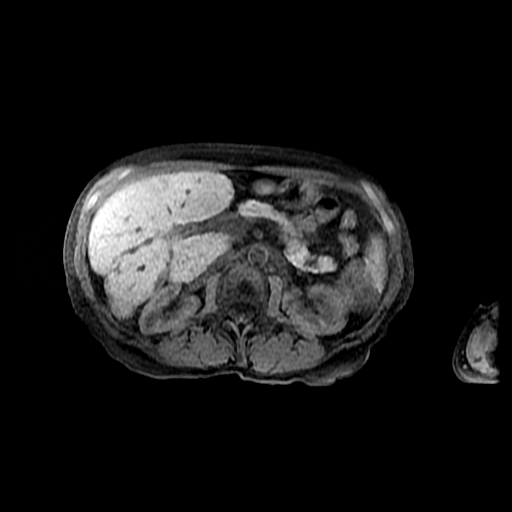
[im 96/96]
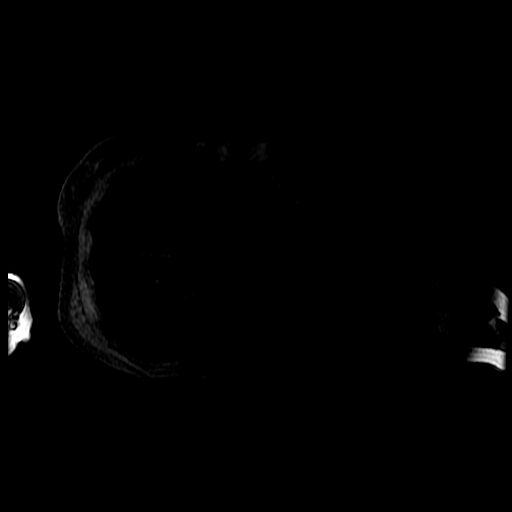

[Series 1001: T1 dynamic · axial · 5.0mm · 0.78mm/px · z∈[-83,+155]mm · 3 of 96 slices shown (2 of 3)]
[im 1/96]
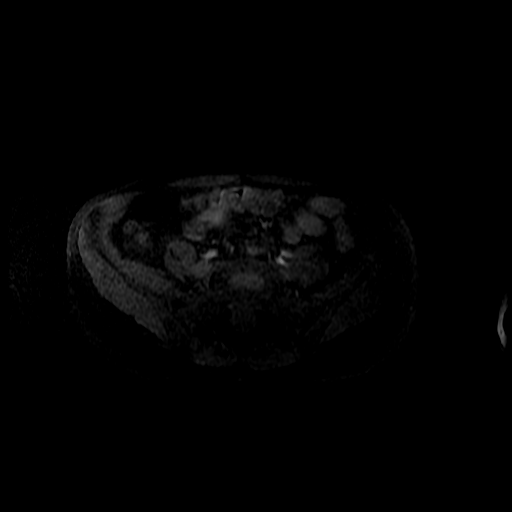
[im 48/96]
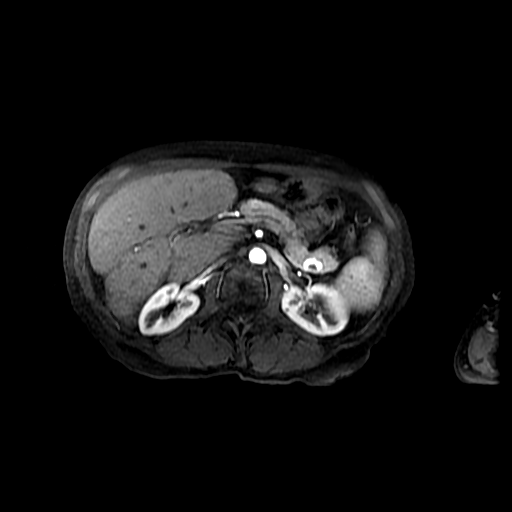
[im 96/96]
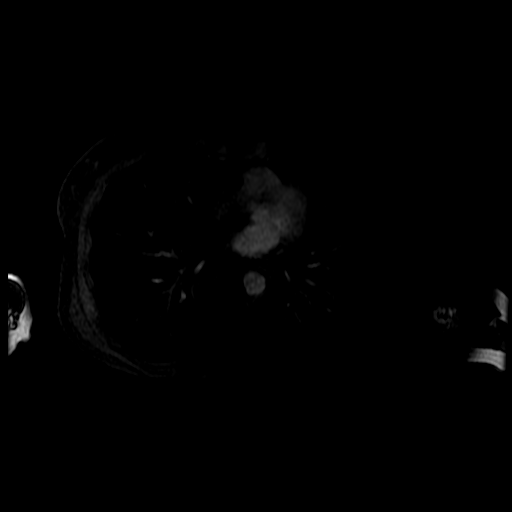

[Series 1002: T1 dynamic · axial · 5.0mm · 0.78mm/px · 1 of 96 slices shown (3 of 3)]
[im 1/96]
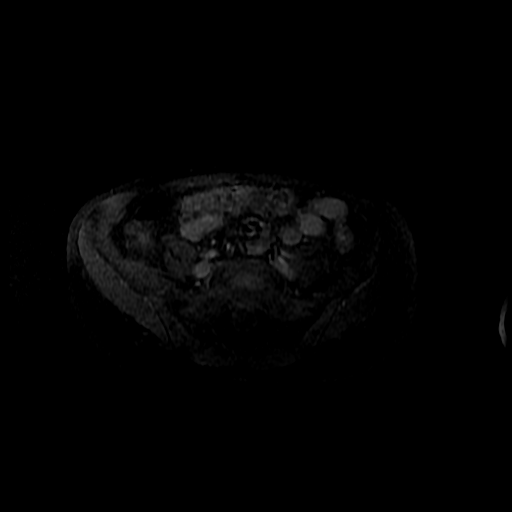

[22 of 48 positions shown; findings below may reference images not displayed]

FINDINGS: Lower chest: Unremarkable

Hepatobiliary: Nodular and lobular liver with scattered bandlike
scarring and fibrosis extending through portions of the right and
left hepatic lobe. No biliary dilatation. Gallbladder unremarkable.
Prominent caudate lobe.

No abnormal focal enhancing lesion in the liver to suggest
hepatocellular carcinoma or other significant lesion.

Pancreas:  Unremarkable

Spleen: Faint speckled hypodensities inferiorly in the spleen with
high T1 and low T2 signal rim along the splenic capsule as on
numerous prior exams. No splenomegaly.

Adrenals/Urinary Tract: Tiny nonenhancing lesions in the left kidney
are mass compatible with small cysts but technically nonspecific.
Adrenal glands normal.

Stomach/Bowel: Unremarkable

Vascular/Lymphatic: Aortoiliac atherosclerotic vascular disease. No
pathologic adenopathy identified. Patent splenic vein. Small varices
along the gastric margin and extending up adjacent to the distal
esophagus.

Other:  No supplemental non-categorized findings.

Musculoskeletal: Degenerative disc disease and degenerative endplate
findings at L5-S1.
IMPRESSION: 1. Considerable hepatic cirrhosis with small gastric and
paraesophageal varices indicating portal venous hypertension. No
splenomegaly or additional significant findings involving the liver.
2. Long-term chronic low T2 and high T1 signal along the splenic
capsule, compatible with fibrous thickening from chronic splenic
congestion. No splenomegaly.
3.  Aortic Atherosclerosis ([id number]).
4. Degenerative disc disease at L5-S1.
